# Patient Record
Sex: Male | Born: 1990 | Hispanic: No | Marital: Married | Smoking: Never smoker
Health system: Southern US, Community
[De-identification: ages and names within clinical notes are randomized; demographics above are authoritative.]

## PROBLEM LIST (undated history)

## (undated) ENCOUNTER — Emergency Department (HOSPITAL_BASED_OUTPATIENT_CLINIC_OR_DEPARTMENT_OTHER): Disposition: A | Discharge status: Home / Self Care | Payer: Self-pay

## (undated) DIAGNOSIS — K219 Gastro-esophageal reflux disease without esophagitis: Secondary | ICD-10-CM

---
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
## 2020-06-13 ENCOUNTER — Other Ambulatory Visit: Payer: Self-pay

## 2020-06-13 ENCOUNTER — Ambulatory Visit (INDEPENDENT_AMBULATORY_CARE_PROVIDER_SITE_OTHER): Payer: Managed Care, Other (non HMO)

## 2020-06-13 ENCOUNTER — Ambulatory Visit (INDEPENDENT_AMBULATORY_CARE_PROVIDER_SITE_OTHER): Payer: Managed Care, Other (non HMO) | Admitting: Podiatry

## 2020-06-13 DIAGNOSIS — M76822 Posterior tibial tendinitis, left leg: Secondary | ICD-10-CM

## 2020-06-13 DIAGNOSIS — M79672 Pain in left foot: Secondary | ICD-10-CM

## 2020-06-13 MED ORDER — TRIAMCINOLONE ACETONIDE 10 MG/ML IJ SUSP
10.0000 mg | Freq: Once | INTRAMUSCULAR | Status: AC
  Administered 2020-06-13: 17:00:00 10 mg

## 2020-06-13 MED ORDER — DICLOFENAC SODIUM 75 MG PO TBEC: 75.0000 mg | DELAYED_RELEASE_TABLET | Freq: Two times a day (BID) | ORAL | 2 refills | Status: DC

## 2020-06-17 NOTE — Progress Notes (Signed)
Subjective:   Patient ID: Henry Diaz, male   DOB: 29 y.o.   MRN: [id number]   HPI Patient presents stating he had a lot of pain in his left foot after rolling his foot and states this is been an ongoing issue and he thinks he has an extra bone in his foot that also has been a problem.  States is been going on for a while off and on and seems to occur about every 6 months and patient does not currently smoke likes to be active   Review of Systems  All other systems reviewed and are negative.       Objective:  Physical Exam Vitals and nursing note reviewed.  Constitutional:      Appearance: He is well-developed and well-nourished.  Cardiovascular:     Pulses: Intact distal pulses.  Pulmonary:     Effort: Pulmonary effort is normal.  Musculoskeletal:        General: Normal range of motion.  Skin:    General: Skin is warm.  Neurological:     Mental Status: He is alert.     Neurovascular status was found to be intact muscle strength was found to be adequate range of motion adequate with inflammation pain of the left posterior tibial tendon as it comes to the insertion navicular with some swelling around the navicular indicating there is inflammation in this area.  Patient is found to have good digit perfusion well oriented x3 with no indication of tendon dysfunction currently.  Patient has good digit perfusion moderate flatfoot deformity     Assessment:  Posterior tibial tendon inflammation with pain at the navicular insertion with possibility for accessory navicular     Plan:  H&P reviewed condition.  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

## 2020-06-24 ENCOUNTER — Other Ambulatory Visit: Payer: Self-pay | Admitting: Podiatry

## 2020-06-24 DIAGNOSIS — M76822 Posterior tibial tendinitis, left leg: Secondary | ICD-10-CM

## 2020-07-04 ENCOUNTER — Ambulatory Visit: Payer: Managed Care, Other (non HMO) | Admitting: Podiatry

## 2020-08-15 ENCOUNTER — Ambulatory Visit: Payer: Managed Care, Other (non HMO) | Admitting: Podiatry

## 2021-04-03 ENCOUNTER — Other Ambulatory Visit: Payer: Self-pay

## 2021-04-03 ENCOUNTER — Ambulatory Visit: Payer: BC Managed Care – PPO | Admitting: Podiatry

## 2021-04-03 DIAGNOSIS — T148XXA Other injury of unspecified body region, initial encounter: Secondary | ICD-10-CM | POA: Diagnosis not present

## 2021-04-03 DIAGNOSIS — M76822 Posterior tibial tendinitis, left leg: Secondary | ICD-10-CM

## 2021-04-03 MED ORDER — DICLOFENAC SODIUM 75 MG PO TBEC: 75.0000 mg | DELAYED_RELEASE_TABLET | Freq: Two times a day (BID) | ORAL | 2 refills | Status: DC

## 2021-04-04 NOTE — Progress Notes (Signed)
Subjective:   Patient ID: Henry Diaz, male   DOB: 30 y.o.   MRN: [id number]   HPI Patient presents stating his left foot has started to really hurt him again and he got good relief for around 4 months after treatment the end of last year but then he played golf and it became bad again   ROS      Objective:  Physical Exam  Neuro vascular status intact with exquisite discomfort around the posterior tibial tendon and inflammation and pain also occurring within the subtalar joint left with most of the pain around the posterior tibial tendon as it inserts into the navicular     Assessment:  Possibility for tear of the posterior tibial tendon left with also accessory navicular hypertrophy of the area     Plan:  H&P reviewed condition great length and I have recommended MRI to try to understand better the pathology with probable consideration for posterior tibial repair along with probable excision of accessory navicular.  This is ordered I spent a great deal time going over it with him and we will get results and decide what could be best for him long-term

## 2021-04-22 ENCOUNTER — Ambulatory Visit
Admission: RE | Admit: 2021-04-22 | Discharge: 2021-04-22 | Disposition: A | Discharge status: Home / Self Care | Payer: BC Managed Care – PPO | Source: Ambulatory Visit | Attending: Podiatry | Admitting: Podiatry

## 2021-04-22 ENCOUNTER — Other Ambulatory Visit: Payer: Self-pay

## 2021-04-22 DIAGNOSIS — M76822 Posterior tibial tendinitis, left leg: Secondary | ICD-10-CM

## 2021-04-22 DIAGNOSIS — T148XXA Other injury of unspecified body region, initial encounter: Secondary | ICD-10-CM

## 2021-05-05 ENCOUNTER — Ambulatory Visit: Payer: BC Managed Care – PPO | Admitting: Podiatry

## 2021-05-05 ENCOUNTER — Encounter: Payer: Self-pay | Admitting: Podiatry

## 2021-05-05 ENCOUNTER — Other Ambulatory Visit: Payer: Self-pay

## 2021-05-05 DIAGNOSIS — M76822 Posterior tibial tendinitis, left leg: Secondary | ICD-10-CM

## 2021-05-05 DIAGNOSIS — D169 Benign neoplasm of bone and articular cartilage, unspecified: Secondary | ICD-10-CM

## 2021-05-05 NOTE — Progress Notes (Signed)
Subjective:   Patient ID: Henry Diaz, male   DOB: 30 y.o.   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  I have recommended resection of the accessory navicular with possible tightening of the posterior tibial tendon with Kidner type procedure and I reviewed consent form with patient going over all possible complications as outlined and the fact that total recovery for something like this can take approximately 6 months.  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  All questions answered to the best of my ability surgery as scheduled encouraged to call questions concerns which may arise with patient to have a resection of the accessory navicular and possible reattachment of the posterior tibial tendon with exploration of the tendon

## 2021-05-08 ENCOUNTER — Telehealth: Payer: Self-pay

## 2021-05-08 NOTE — Telephone Encounter (Signed)
I would prefer not to use a steroid this close to surgery. He could have meloicam 15mg . One per day to take for the next several weeks and stop one week prior to procedure

## 2021-05-08 NOTE — Telephone Encounter (Signed)
Error

## 2021-05-08 NOTE — Telephone Encounter (Signed)
   Is there another anti-inflammatory steroid he can take to keep the swelling down with until his surgery?   1. Which medications need to be refilled? (please list name of each medication and dose if known) prednisone ?   2. Which pharmacy/location (including street and city if local pharmacy) is medication to be sent to? [place] and [address]   3. Requesting another round of prednisone to keep the swelling down

## 2021-05-09 ENCOUNTER — Telehealth: Payer: Self-pay | Admitting: Urology

## 2021-05-09 NOTE — Telephone Encounter (Signed)
Spoke with patient he is agreeable to this plan, he said he will only take the meloicam 15mg  if he is having swelling.  If you could call the [place]   [place]  - [facility] - [phone]

## 2021-05-09 NOTE — Telephone Encounter (Signed)
DOS - 06/03/21  KINDER ADVANCED TENDON LEFT ---[id number]   [employer] EFFECTIVEW DATE - 02/27/21   NO PRIOR AUTH REQUIRED

## 2021-05-13 ENCOUNTER — Telehealth: Payer: Self-pay

## 2021-05-13 NOTE — Telephone Encounter (Signed)
Stacie called to cancel his surgery with Dr. Paulla Dolly on 06/03/2021. He stated he got a second opinion and will be having surgery with that provider. Notified Dr. Paulla Dolly and Caren Griffins with [facility]

## 2021-06-09 ENCOUNTER — Encounter: Payer: BC Managed Care – PPO | Admitting: Podiatry

## 2021-08-12 ENCOUNTER — Ambulatory Visit: Payer: Self-pay

## 2021-08-12 NOTE — Telephone Encounter (Signed)
Pt. Asking if [facility] offers "IV drips with vitamins." Instructed pt. He could ask at reception and could be treated as needed.

## 2021-08-21 ENCOUNTER — Encounter (HOSPITAL_BASED_OUTPATIENT_CLINIC_OR_DEPARTMENT_OTHER): Payer: Self-pay | Admitting: Emergency Medicine

## 2021-08-21 ENCOUNTER — Inpatient Hospital Stay (HOSPITAL_BASED_OUTPATIENT_CLINIC_OR_DEPARTMENT_OTHER)
Admission: EM | Admit: 2021-08-21 | Discharge: 2021-08-24 | DRG: 392 | Disposition: A | Discharge status: Home / Self Care | Payer: BC Managed Care – PPO | Attending: Internal Medicine | Admitting: Internal Medicine

## 2021-08-21 ENCOUNTER — Emergency Department (HOSPITAL_BASED_OUTPATIENT_CLINIC_OR_DEPARTMENT_OTHER): Payer: BC Managed Care – PPO | Admitting: Radiology

## 2021-08-21 ENCOUNTER — Emergency Department (HOSPITAL_BASED_OUTPATIENT_CLINIC_OR_DEPARTMENT_OTHER): Payer: BC Managed Care – PPO

## 2021-08-21 ENCOUNTER — Other Ambulatory Visit: Payer: Self-pay

## 2021-08-21 DIAGNOSIS — R1310 Dysphagia, unspecified: Secondary | ICD-10-CM

## 2021-08-21 DIAGNOSIS — Z789 Other specified health status: Secondary | ICD-10-CM | POA: Diagnosis present

## 2021-08-21 DIAGNOSIS — E663 Overweight: Secondary | ICD-10-CM | POA: Diagnosis present

## 2021-08-21 DIAGNOSIS — K2980 Duodenitis without bleeding: Secondary | ICD-10-CM | POA: Diagnosis present

## 2021-08-21 DIAGNOSIS — E876 Hypokalemia: Principal | ICD-10-CM | POA: Diagnosis present

## 2021-08-21 DIAGNOSIS — K760 Fatty (change of) liver, not elsewhere classified: Secondary | ICD-10-CM | POA: Diagnosis present

## 2021-08-21 DIAGNOSIS — K449 Diaphragmatic hernia without obstruction or gangrene: Secondary | ICD-10-CM | POA: Diagnosis present

## 2021-08-21 DIAGNOSIS — Z808 Family history of malignant neoplasm of other organs or systems: Secondary | ICD-10-CM

## 2021-08-21 DIAGNOSIS — E860 Dehydration: Secondary | ICD-10-CM | POA: Diagnosis present

## 2021-08-21 DIAGNOSIS — Z20822 Contact with and (suspected) exposure to covid-19: Secondary | ICD-10-CM | POA: Diagnosis present

## 2021-08-21 DIAGNOSIS — Z79899 Other long term (current) drug therapy: Secondary | ICD-10-CM

## 2021-08-21 DIAGNOSIS — K2960 Other gastritis without bleeding: Secondary | ICD-10-CM | POA: Diagnosis present

## 2021-08-21 DIAGNOSIS — K2100 Gastro-esophageal reflux disease with esophagitis, without bleeding: Principal | ICD-10-CM | POA: Diagnosis present

## 2021-08-21 DIAGNOSIS — R9431 Abnormal electrocardiogram [ECG] [EKG]: Secondary | ICD-10-CM | POA: Diagnosis present

## 2021-08-21 DIAGNOSIS — R634 Abnormal weight loss: Secondary | ICD-10-CM | POA: Diagnosis present

## 2021-08-21 DIAGNOSIS — K222 Esophageal obstruction: Secondary | ICD-10-CM | POA: Diagnosis present

## 2021-08-21 DIAGNOSIS — R202 Paresthesia of skin: Secondary | ICD-10-CM | POA: Diagnosis present

## 2021-08-21 DIAGNOSIS — K2210 Ulcer of esophagus without bleeding: Secondary | ICD-10-CM | POA: Diagnosis present

## 2021-08-21 DIAGNOSIS — Z6827 Body mass index (BMI) 27.0-27.9, adult: Secondary | ICD-10-CM

## 2021-08-21 DIAGNOSIS — D539 Nutritional anemia, unspecified: Secondary | ICD-10-CM | POA: Diagnosis present

## 2021-08-21 DIAGNOSIS — F1010 Alcohol abuse, uncomplicated: Secondary | ICD-10-CM | POA: Diagnosis present

## 2021-08-21 DIAGNOSIS — Z888 Allergy status to other drugs, medicaments and biological substances status: Secondary | ICD-10-CM

## 2021-08-21 HISTORY — DX: Gastro-esophageal reflux disease without esophagitis: K21.9

## 2021-08-21 LAB — COMPREHENSIVE METABOLIC PANEL
ALT: 47 U/L — ABNORMAL HIGH (ref 0–44)
AST: 87 U/L — ABNORMAL HIGH (ref 15–41)
Albumin: 3.6 g/dL (ref 3.5–5.0)
Alkaline Phosphatase: 80 U/L (ref 38–126)
Anion gap: 13 (ref 5–15)
BUN: 6 mg/dL (ref 6–20)
CO2: 39 mmol/L — ABNORMAL HIGH (ref 22–32)
Calcium: 8.8 mg/dL — ABNORMAL LOW (ref 8.9–10.3)
Chloride: 87 mmol/L — ABNORMAL LOW (ref 98–111)
Creatinine, Ser: 0.73 mg/dL (ref 0.61–1.24)
GFR, Estimated: >60 mL/min (ref >60)
Glucose, Bld: 115 mg/dL — ABNORMAL HIGH (ref 70–99)
Potassium: 2.1 mmol/L — CL (ref 3.5–5.1)
Sodium: 139 mmol/L (ref 135–145)
Total Bilirubin: 1.5 mg/dL — ABNORMAL HIGH (ref 0.3–1.2)
Total Protein: 5.4 g/dL — ABNORMAL LOW (ref 6.5–8.1)

## 2021-08-21 LAB — CBC WITH DIFFERENTIAL/PLATELET
Abs Immature Granulocytes: 0.01 10*3/uL (ref 0.00–0.07)
Basophils Absolute: 0 10*3/uL (ref 0.0–0.1)
Basophils Relative: 1 %
Eosinophils Absolute: 0 10*3/uL (ref 0.0–0.5)
Eosinophils Relative: 0 %
HCT: 37.1 % — ABNORMAL LOW (ref 39.0–52.0)
Hemoglobin: 13.8 g/dL (ref 13.0–17.0)
Immature Granulocytes: 0 %
Lymphocytes Relative: 26 %
Lymphs Abs: 1.6 10*3/uL (ref 0.7–4.0)
MCH: 36.3 pg — ABNORMAL HIGH (ref 26.0–34.0)
MCHC: 37.2 g/dL — ABNORMAL HIGH (ref 30.0–36.0)
MCV: 97.6 fL (ref 80.0–100.0)
Monocytes Absolute: 0.5 10*3/uL (ref 0.1–1.0)
Monocytes Relative: 8 %
Neutro Abs: 4 10*3/uL (ref 1.7–7.7)
Neutrophils Relative %: 65 %
Platelets: 280 10*3/uL (ref 150–400)
RBC: 3.8 MIL/uL — ABNORMAL LOW (ref 4.22–5.81)
RDW: 15.8 % — ABNORMAL HIGH (ref 11.5–15.5)
WBC: 6.2 10*3/uL (ref 4.0–10.5)
nRBC: 0 % (ref 0.0–0.2)

## 2021-08-21 LAB — RESP PANEL BY RT-PCR (FLU A&B, COVID) ARPGX2
Influenza A by PCR: NEGATIVE
Influenza B by PCR: NEGATIVE
SARS Coronavirus 2 by RT PCR: NEGATIVE

## 2021-08-21 LAB — MAGNESIUM: Magnesium: 1.5 mg/dL — ABNORMAL LOW (ref 1.7–2.4)

## 2021-08-21 LAB — LIPASE, BLOOD: Lipase: 34 U/L (ref 11–51)

## 2021-08-21 MED ORDER — MAGNESIUM SULFATE 2 GM/50ML IV SOLN
2.0000 g | Freq: Once | INTRAVENOUS | Status: AC
  Administered 2021-08-21: 2 g via INTRAVENOUS
  Filled 2021-08-21: qty 50

## 2021-08-21 MED ORDER — POTASSIUM CHLORIDE 10 MEQ/100ML IV SOLN
10.0000 meq | INTRAVENOUS | Status: AC
  Administered 2021-08-21 (×3): 10 meq via INTRAVENOUS
  Filled 2021-08-21 (×2): qty 100

## 2021-08-21 MED ORDER — SODIUM CHLORIDE 0.9 % IV BOLUS
1000.0000 mL | Freq: Once | INTRAVENOUS | Status: AC
  Administered 2021-08-21: 1000 mL via INTRAVENOUS

## 2021-08-21 MED ORDER — IOHEXOL 300 MG/ML  SOLN
100.0000 mL | Freq: Once | INTRAMUSCULAR | Status: AC | PRN
  Administered 2021-08-21: 100 mL via INTRAVENOUS

## 2021-08-21 MED ORDER — POTASSIUM CHLORIDE CRYS ER 20 MEQ PO TBCR
40.0000 meq | EXTENDED_RELEASE_TABLET | Freq: Once | ORAL | Status: AC
  Administered 2021-08-21: 40 meq via ORAL
  Filled 2021-08-21: qty 2

## 2021-08-21 NOTE — ED Triage Notes (Signed)
States  had blood frawn yesterday and was called and told his K was 2.8 , pt states he has been feeling nausea and  vomiting x months , first time saw dr was yesterday has lost wight 55 lbs in 5 monts

## 2021-08-21 NOTE — ED Notes (Signed)
Called [facility] to transport patient to [facility] room# [phone]

## 2021-08-21 NOTE — ED Provider Notes (Signed)
[facility] EMERGENCY DEPT Provider Note   CSN: [id number] Arrival date & time: 08/21/21  1733     History  Chief Complaint  Patient presents with   Weakness    Henry Diaz is a 31 y.o. male.  31 year old male presents today for evaluation of low potassium.  Patient reports he was evaluated by PCP yesterday and was called today due to low potassium level and was told to come to the emergency room for critical level.  Patient states that his potassium was 2.8.  Patient denies diarrhea.  He does state he has had significantly poor intake over the past 5 months and has lost 55 pounds.  States he has not been able to tolerate any solid foods and if he does eat solid food most of the time he will throw it up within 20 minutes.  States he is able to take in shakes, Ensure, water, or pills without difficulty.  Does endorse abdominal pain.  Denies fever, chills, chest pain, shortness of breath.  Denies night sweats.  Does report tingling in bilateral hands.  Yesterday was his first visit with PCP for work-up of his symptoms.  He has been referred to gastroenterologist and is awaiting a call to get scheduled.  The history is provided by the patient. No language interpreter was used.      Home Medications Prior to Admission medications   Medication Sig Start Date End Date Taking? Authorizing Provider  albuterol (VENTOLIN HFA) 108 (90 Base) MCG/ACT inhaler 2 puffs q.i.d. p.r.n. short of breath, wheezing, or cough 11/18/16   [provider]  azithromycin (ZITHROMAX) 250 MG tablet azithromycin 250 mg tablet    [provider]  benzonatate (TESSALON) 200 MG capsule benzonatate 200 mg capsule 11/24/16   [provider]  ciclopirox (LOPROX) 0.77 % cream ciclopirox 0.77 % topical cream  APPLY TO THE AFFECTED AND SURROUNDING AREAS OF SKIN BY TOPICAL ROUTE 2 TIMES PER DAY IN THE MORNING AND EVENING 2-3 WEEKS    [provider]  ciprofloxacin (CIPRO) 500 MG  tablet ciprofloxacin 500 mg tablet  TAKE 1 TABLET BY MOUTH EVERY 12 HOURS FOR 21 DAYS    [provider]  diclofenac (VOLTAREN) 75 MG EC tablet Take 1 tablet (75 mg total) by mouth 2 (two) times daily. 06/13/20   Wallene Huh, DPM  diclofenac (VOLTAREN) 75 MG EC tablet Take 1 tablet (75 mg total) by mouth 2 (two) times daily. 04/03/21   Wallene Huh, DPM  doxycycline (VIBRA-TABS) 100 MG tablet doxycycline hyclate 100 mg tablet  TK 1 T PO BID FOR 7 DAYS    [provider]  ibuprofen (ADVIL) 800 MG tablet ibuprofen 800 mg tablet    [provider]      Allergies    Ceclor [cefaclor]    Review of Systems   Review of Systems  Constitutional:  Positive for appetite change and fatigue. Negative for chills and fever.  Respiratory:  Negative for cough and shortness of breath.   Cardiovascular:  Negative for chest pain.  Gastrointestinal:  Positive for abdominal pain and vomiting. Negative for constipation and nausea.  Genitourinary:  Negative for dysuria.  All other systems reviewed and are negative.  Physical Exam Updated Vital Signs BP (!) 132/97    Pulse (!) 121    Temp 99.7 F (37.6 C)    Resp 16    Ht 6\' 1"  (1.854 m)    Wt 93.4 kg    SpO2 97%  BMI 27.18 kg/m  Physical Exam Vitals and nursing note reviewed.  Constitutional:      General: He is not in acute distress.    Appearance: Normal appearance. He is not ill-appearing.  HENT:     Head: Normocephalic and atraumatic.     Nose: Nose normal.  Eyes:     General: No scleral icterus.    Extraocular Movements: Extraocular movements intact.     Conjunctiva/sclera: Conjunctivae normal.  Cardiovascular:     Rate and Rhythm: Regular rhythm. Tachycardia present.     Pulses: Normal pulses.     Heart sounds: Normal heart sounds.  Pulmonary:     Effort: Pulmonary effort is normal. No respiratory distress.     Breath sounds: Normal breath sounds. No wheezing or rales.  Abdominal:     General: There is  no distension.     Tenderness: There is abdominal tenderness. There is no right CVA tenderness, left CVA tenderness or guarding.  Musculoskeletal:        General: Normal range of motion.     Cervical back: Normal range of motion.     Right lower leg: No edema.     Left lower leg: No edema.  Skin:    General: Skin is warm and dry.  Neurological:     General: No focal deficit present.     Mental Status: He is alert. Mental status is at baseline.    ED Results / Procedures / Treatments   Labs (all labs ordered are listed, but only abnormal results are displayed) Labs Reviewed  CBC WITH DIFFERENTIAL/PLATELET  COMPREHENSIVE METABOLIC PANEL  MAGNESIUM  LIPASE, BLOOD    EKG None  Radiology No results found.  Procedures .Critical Care Performed by: Evlyn Courier, PA-C Authorized by: Evlyn Courier, PA-C   Critical care provider statement:    Critical care time (minutes):  35   Critical care was necessary to treat or prevent imminent or life-threatening deterioration of the following conditions:  Cardiac failure and metabolic crisis   Critical care was time spent personally by me on the following activities:  Development of treatment plan with patient or surrogate, discussions with consultants, evaluation of patient's response to treatment, examination of patient, ordering and review of laboratory studies, ordering and review of radiographic studies, ordering and performing treatments and interventions, pulse oximetry, re-evaluation of patient's condition and review of old charts   Care discussed with: admitting provider      Medications Ordered in ED Medications  sodium chloride 0.9 % bolus 1,000 mL (has no administration in time range)    ED Course/ Medical Decision Making/ A&P Clinical Course as of 08/21/21 2100  Thu Aug 21, 7338  [id number] 31 year old male present emergency department with complaint of loss of appetite and some dysphagia, it began around Thanksgiving of last year.   He reports that whenever he tries to eat or swallow he feels like the food gets stuck in his midesophagus, and he often vomits afterwards.  He has lost a lot of weight.  He has been trying protein shakes, can often keep down some liquids.  No diarrhea recently.  In the ED his vital signs been within normal limits.  He does have some electrolyte derangement potassium of 2.1 magnesium 1.5.  Both are being repleted IV and orally.  EKG shows sinus rhythm prolonged QTc over 600 per my interpretation.  He will need a repeat EKG after repletion, will be admitted for IV electrolyte repletion.  His PCP is already placed a  referral to gastroenterology for his dysphagia, which I think is appropriate.  His CT scan of the abdomen here did not show signs of evident mass or obstruction as a cause for his vomiting. [MT]    Clinical Course User Index [MT] Trifan, Carola Rhine, MD                           Medical Decision Making Amount and/or Complexity of Data Reviewed Labs: ordered. Radiology: ordered. ECG/medicine tests: ordered.  Risk Prescription drug management.   Medical Decision Making / ED Course   This patient presents to the ED for concern of hypokalemia, lack of appetite, this involves an extensive number of treatment options, and is a complaint that carries with it a high risk of complications and morbidity.  The differential diagnosis includes gastroenteritis, malignancy, cirrhosis, other acute intra-abdominal process  MDM: 31 year old male presents today for evaluation of low potassium level.  Do not have access to labs from PCP.  Will obtain CBC, CMP, magnesium, lipase, CT abdomen and pelvis.  He is tachycardic.  Will provide IV hydration.  Will await CMP before potassium repletion. CMP significant for potassium of 2.1.  EKG with changes.  Mildly elevated transaminitis.  Chloride of 87, glucose of 115, total bilirubin of 1.5 otherwise without acute findings.  CBC without acute findings.  Magnesium  of 1.5.  Lipase within normal limits.  CT abdomen pelvis shows severe fatty liver otherwise without acute findings.  Potassium repletion ordered p.o. plus IV push.  70 mEq total ordered at this time.  Mild improvement in tachycardia following volume repletion.  Patient also drinks about 4 beers per night.  Last drink last night.  Will place on CIWA protocol.  Case discussed with hospitalist who will accept patient for admission.  Recommends chest x-ray and CIWA protocol.  Ordered both.   Additional history obtained: -Additional history obtained from wife at bedside -External records from outside source obtained and reviewed including: Chart review including previous notes, labs, imaging, consultation notes   Lab Tests: -I ordered, reviewed, and interpreted labs.   The pertinent results include:   Labs Reviewed  CBC WITH DIFFERENTIAL/PLATELET  COMPREHENSIVE METABOLIC PANEL  MAGNESIUM  LIPASE, BLOOD      EKG  EKG Interpretation  Date/Time:    Ventricular Rate:    PR Interval:    QRS Duration:   QT Interval:    QTC Calculation:   R Axis:     Text Interpretation:           Imaging Studies ordered: I ordered imaging studies including CXR I independently visualized and interpreted imaging. I agree with the radiologist interpretation   Medicines ordered and prescription drug management: Meds ordered this encounter  Medications   sodium chloride 0.9 % bolus 1,000 mL    -I have reviewed the patients home medicines and have made adjustments as needed  Critical interventions IV potassium and p.o. potassium repletion.  Potassium of 2.1.  Cardiac Monitoring: The patient was maintained on a cardiac monitor.  I personally viewed and interpreted the cardiac monitored which showed an underlying rhythm of: Sinus tachycardia  Reevaluation: After the interventions noted above, I reevaluated the patient and found that they have :improved  Co morbidities that complicate the  patient evaluation  Past Medical History:  Diagnosis Date   GERD (gastroesophageal reflux disease)       Dispostion: Patient will be admitted for further work-up and management.  Final Clinical Impression(s) /  ED Diagnoses Final diagnoses:  Hypokalemia  Dysphagia, unspecified type    Rx / DC Orders ED Discharge Orders     None         Evlyn Courier, PA-C 08/21/21 2101    Wyvonnia Dusky, MD 08/22/21 Laureen Abrahams

## 2021-08-21 NOTE — Progress Notes (Signed)
Plan of Care Note for accepted transfer   Patient: Henry Diaz MRN: [id number]   DOA: 08/21/2021  Facility requesting transfer: [facility] Requesting Provider: PA Amjad Deatra Canter Reason for transfer: Inability to tolerate oral intake, severe hypokalemia Facility course:   31 year old male with past medical history of alcohol abuse (drinks at least 4 alcoholic drinks daily) who has been experiencing a 41-month history of frequent vomiting and unintentional weight loss.  Patient states that he is able to tolerate liquids but usually vomits up solids approximately 20 minutes after eating.  Over the span of time patient reports a 55 pound weight loss.  Patient was seen by his primary care provider yesterday.  During that visit the patient was referred to gastroenterology but the patient has yet to be seen.  Furthermore blood work identified a critically low potassium advised the patient to come to the emergency department.  Upon evaluation in the emergency department patient was found to have a potassium of 2.1 with magnesium of 1.5.  Patient was given 2 g of intravenous magnesium sulfate as well as a total of 70 mill equivalents of potassium.  Patient's last alcoholic drink was yesterday evening and therefore I advised initiating CIWA protocol considering the patient has been tachycardic throughout the emergency department stay.    ER providers requesting hospitalization for continued correction of electrolytes, hydration and work-up of unintentional weight loss including possible gastroenterology consultation.  Gastroenterology has yet to be contacted.  Plan of care: The patient is accepted for admission to Progressive unit, at [facility]    Author: Vernelle Emerald, MD 08/21/2021  Check [email or web address] for on-call coverage.  Nursing staff, Please call [facility] number on Amion as soon as patient's arrival, so appropriate admitting provider can evaluate the  pt.

## 2021-08-22 DIAGNOSIS — K2100 Gastro-esophageal reflux disease with esophagitis, without bleeding: Secondary | ICD-10-CM | POA: Diagnosis present

## 2021-08-22 DIAGNOSIS — K2210 Ulcer of esophagus without bleeding: Secondary | ICD-10-CM | POA: Diagnosis not present

## 2021-08-22 DIAGNOSIS — R9431 Abnormal electrocardiogram [ECG] [EKG]: Secondary | ICD-10-CM | POA: Diagnosis not present

## 2021-08-22 DIAGNOSIS — F1090 Alcohol use, unspecified, uncomplicated: Secondary | ICD-10-CM | POA: Diagnosis present

## 2021-08-22 DIAGNOSIS — D539 Nutritional anemia, unspecified: Secondary | ICD-10-CM | POA: Diagnosis not present

## 2021-08-22 DIAGNOSIS — K449 Diaphragmatic hernia without obstruction or gangrene: Secondary | ICD-10-CM | POA: Diagnosis not present

## 2021-08-22 DIAGNOSIS — Z79899 Other long term (current) drug therapy: Secondary | ICD-10-CM | POA: Diagnosis not present

## 2021-08-22 DIAGNOSIS — K760 Fatty (change of) liver, not elsewhere classified: Secondary | ICD-10-CM

## 2021-08-22 DIAGNOSIS — R634 Abnormal weight loss: Secondary | ICD-10-CM | POA: Diagnosis present

## 2021-08-22 DIAGNOSIS — K2980 Duodenitis without bleeding: Secondary | ICD-10-CM | POA: Diagnosis not present

## 2021-08-22 DIAGNOSIS — Z808 Family history of malignant neoplasm of other organs or systems: Secondary | ICD-10-CM | POA: Diagnosis not present

## 2021-08-22 DIAGNOSIS — R1310 Dysphagia, unspecified: Secondary | ICD-10-CM

## 2021-08-22 DIAGNOSIS — K222 Esophageal obstruction: Secondary | ICD-10-CM | POA: Diagnosis not present

## 2021-08-22 DIAGNOSIS — E876 Hypokalemia: Secondary | ICD-10-CM | POA: Diagnosis present

## 2021-08-22 DIAGNOSIS — F1010 Alcohol abuse, uncomplicated: Secondary | ICD-10-CM | POA: Diagnosis not present

## 2021-08-22 DIAGNOSIS — K2960 Other gastritis without bleeding: Secondary | ICD-10-CM | POA: Diagnosis not present

## 2021-08-22 DIAGNOSIS — Z6827 Body mass index (BMI) 27.0-27.9, adult: Secondary | ICD-10-CM | POA: Diagnosis not present

## 2021-08-22 DIAGNOSIS — R202 Paresthesia of skin: Secondary | ICD-10-CM | POA: Diagnosis not present

## 2021-08-22 DIAGNOSIS — Z888 Allergy status to other drugs, medicaments and biological substances status: Secondary | ICD-10-CM | POA: Diagnosis not present

## 2021-08-22 DIAGNOSIS — E860 Dehydration: Secondary | ICD-10-CM | POA: Diagnosis not present

## 2021-08-22 DIAGNOSIS — Z789 Other specified health status: Secondary | ICD-10-CM

## 2021-08-22 DIAGNOSIS — E663 Overweight: Secondary | ICD-10-CM | POA: Diagnosis not present

## 2021-08-22 DIAGNOSIS — Z20822 Contact with and (suspected) exposure to covid-19: Secondary | ICD-10-CM | POA: Diagnosis not present

## 2021-08-22 LAB — COMPREHENSIVE METABOLIC PANEL
ALT: 43 U/L (ref 0–44)
AST: 79 U/L — ABNORMAL HIGH (ref 15–41)
Albumin: 2.9 g/dL — ABNORMAL LOW (ref 3.5–5.0)
Alkaline Phosphatase: 66 U/L (ref 38–126)
Anion gap: 9 (ref 5–15)
BUN: 6 mg/dL (ref 6–20)
CO2: 35 mmol/L — ABNORMAL HIGH (ref 22–32)
Calcium: 7.9 mg/dL — ABNORMAL LOW (ref 8.9–10.3)
Chloride: 92 mmol/L — ABNORMAL LOW (ref 98–111)
Creatinine, Ser: 0.73 mg/dL (ref 0.61–1.24)
GFR, Estimated: >60 mL/min (ref >60)
Glucose, Bld: 99 mg/dL (ref 70–99)
Potassium: 2.2 mmol/L — CL (ref 3.5–5.1)
Sodium: 136 mmol/L (ref 135–145)
Total Bilirubin: 1.8 mg/dL — ABNORMAL HIGH (ref 0.3–1.2)
Total Protein: 5.1 g/dL — ABNORMAL LOW (ref 6.5–8.1)

## 2021-08-22 LAB — BASIC METABOLIC PANEL
Anion gap: 9 (ref 5–15)
BUN: <5 mg/dL — ABNORMAL LOW (ref 6–20)
CO2: 37 mmol/L — ABNORMAL HIGH (ref 22–32)
Calcium: 8.1 mg/dL — ABNORMAL LOW (ref 8.9–10.3)
Chloride: 91 mmol/L — ABNORMAL LOW (ref 98–111)
Creatinine, Ser: 0.72 mg/dL (ref 0.61–1.24)
GFR, Estimated: >60 mL/min (ref >60)
Glucose, Bld: 102 mg/dL — ABNORMAL HIGH (ref 70–99)
Potassium: 2.6 mmol/L — CL (ref 3.5–5.1)
Sodium: 137 mmol/L (ref 135–145)

## 2021-08-22 LAB — PHOSPHORUS: Phosphorus: 2.8 mg/dL (ref 2.5–4.6)

## 2021-08-22 LAB — POTASSIUM
Potassium: 2.7 mmol/L — CL (ref 3.5–5.1)
Potassium: 2.6 mmol/L — CL (ref 3.5–5.1)

## 2021-08-22 LAB — MAGNESIUM: Magnesium: 2.2 mg/dL (ref 1.7–2.4)

## 2021-08-22 LAB — HIV ANTIBODY (ROUTINE TESTING W REFLEX): HIV Screen 4th Generation wRfx: NONREACTIVE

## 2021-08-22 MED ORDER — ALBUTEROL SULFATE (2.5 MG/3ML) 0.083% IN NEBU: 3.0000 mL | INHALATION_SOLUTION | RESPIRATORY_TRACT | Status: DC | PRN

## 2021-08-22 MED ORDER — ACETAMINOPHEN 650 MG RE SUPP: 650.0000 mg | Freq: Four times a day (QID) | RECTAL | Status: DC | PRN

## 2021-08-22 MED ORDER — POTASSIUM CHLORIDE CRYS ER 20 MEQ PO TBCR
40.0000 meq | EXTENDED_RELEASE_TABLET | Freq: Once | ORAL | Status: AC
  Administered 2021-08-22: 40 meq via ORAL
  Filled 2021-08-22: qty 2

## 2021-08-22 MED ORDER — PANTOPRAZOLE SODIUM 40 MG IV SOLR
40.0000 mg | Freq: Two times a day (BID) | INTRAVENOUS | Status: DC
  Administered 2021-08-22 – 2021-08-24 (×5): 40 mg via INTRAVENOUS
  Filled 2021-08-22 (×5): qty 10

## 2021-08-22 MED ORDER — THIAMINE HCL 100 MG PO TABS
100.0000 mg | ORAL_TABLET | Freq: Every day | ORAL | Status: DC
  Administered 2021-08-22: 100 mg via ORAL
  Filled 2021-08-22 (×3): qty 1

## 2021-08-22 MED ORDER — ONDANSETRON HCL 4 MG/2ML IJ SOLN: 4.0000 mg | Freq: Four times a day (QID) | INTRAMUSCULAR | Status: DC | PRN

## 2021-08-22 MED ORDER — THIAMINE HCL 100 MG/ML IJ SOLN
100.0000 mg | Freq: Every day | INTRAMUSCULAR | Status: DC
  Administered 2021-08-23: 100 mg via INTRAVENOUS
  Filled 2021-08-22 (×2): qty 2

## 2021-08-22 MED ORDER — POTASSIUM CHLORIDE 10 MEQ/100ML IV SOLN
10.0000 meq | INTRAVENOUS | Status: AC
  Administered 2021-08-22 – 2021-08-23 (×2): 10 meq via INTRAVENOUS
  Filled 2021-08-22 (×2): qty 100

## 2021-08-22 MED ORDER — LORAZEPAM 2 MG/ML IJ SOLN: 0.0000 mg | Freq: Two times a day (BID) | INTRAMUSCULAR | Status: DC

## 2021-08-22 MED ORDER — ADULT MULTIVITAMIN W/MINERALS CH
1.0000 | ORAL_TABLET | Freq: Every day | ORAL | Status: DC
  Administered 2021-08-22 – 2021-08-24 (×2): 1 via ORAL
  Filled 2021-08-22 (×3): qty 1

## 2021-08-22 MED ORDER — MELATONIN 5 MG PO TABS
5.0000 mg | ORAL_TABLET | Freq: Every day | ORAL | Status: DC
  Administered 2021-08-22 – 2021-08-23 (×2): 5 mg via ORAL
  Filled 2021-08-22 (×2): qty 1

## 2021-08-22 MED ORDER — FOLIC ACID 1 MG PO TABS
1.0000 mg | ORAL_TABLET | Freq: Every day | ORAL | Status: DC
  Administered 2021-08-22 – 2021-08-24 (×2): 1 mg via ORAL
  Filled 2021-08-22 (×3): qty 1

## 2021-08-22 MED ORDER — ACETAMINOPHEN 325 MG PO TABS: 650.0000 mg | ORAL_TABLET | Freq: Four times a day (QID) | ORAL | Status: DC | PRN

## 2021-08-22 MED ORDER — POTASSIUM CHLORIDE CRYS ER 20 MEQ PO TBCR
40.0000 meq | EXTENDED_RELEASE_TABLET | ORAL | Status: AC
  Administered 2021-08-22 (×2): 40 meq via ORAL
  Filled 2021-08-22 (×2): qty 2

## 2021-08-22 MED ORDER — LORAZEPAM 2 MG/ML IJ SOLN
1.0000 mg | INTRAMUSCULAR | Status: DC | PRN
  Administered 2021-08-23: 1 mg via INTRAVENOUS

## 2021-08-22 MED ORDER — KCL IN DEXTROSE-NACL 20-5-0.45 MEQ/L-%-% IV SOLN
INTRAVENOUS | Status: DC
  Filled 2021-08-22 (×4): qty 1000

## 2021-08-22 MED ORDER — POTASSIUM CHLORIDE 10 MEQ/100ML IV SOLN
10.0000 meq | INTRAVENOUS | Status: AC
  Administered 2021-08-22 (×2): 10 meq via INTRAVENOUS
  Filled 2021-08-22 (×2): qty 100

## 2021-08-22 MED ORDER — LORAZEPAM 2 MG/ML IJ SOLN
0.0000 mg | Freq: Four times a day (QID) | INTRAMUSCULAR | Status: AC
  Filled 2021-08-22: qty 1

## 2021-08-22 MED ORDER — LORAZEPAM 1 MG PO TABS
1.0000 mg | ORAL_TABLET | ORAL | Status: DC | PRN
  Administered 2021-08-22: 1 mg via ORAL
  Filled 2021-08-22: qty 1

## 2021-08-22 MED ORDER — ONDANSETRON HCL 4 MG PO TABS
4.0000 mg | ORAL_TABLET | Freq: Four times a day (QID) | ORAL | Status: DC | PRN
Start: 2021-08-22 — End: 2021-08-24

## 2021-08-22 NOTE — Assessment & Plan Note (Addendum)
-   Status post EGD.  Findings as below.  Currently tolerating diet.  Outpatient follow-up with GI.

## 2021-08-22 NOTE — Assessment & Plan Note (Addendum)
-   Secondary to dysphagia, has lost close to 50 pounds in the last 6 months, most of it in the last 2 months

## 2021-08-22 NOTE — Assessment & Plan Note (Addendum)
-   Treated with aggressive oral and IV replacement.  Potassium 3.5 this morning.  Will give K Dur 40 mEq 1 dose today. -Continue supplementation with oral potassium upon discharge with outpatient follow-up of BMP within a week by PCP.  Encourage oral intake.

## 2021-08-22 NOTE — Assessment & Plan Note (Addendum)
-   Treated with IV fluids.  Improved.

## 2021-08-22 NOTE — Assessment & Plan Note (Addendum)
-   Likely secondary to severe hypokalemia, hypomagnesemia -replaced potassium, magnesium.   -Avoid QT prolonging medications.

## 2021-08-22 NOTE — H&P (View-Only) (Signed)
Referring Provider: Mendel Corning, MD Primary Care Physician:  Everardo Beals, NP Primary Gastroenterologist:  Althia Forts  Reason for Consultation:  Dysphagia, weight loss  HPI: Henry Diaz is a 31 y.o. male with past medical history of alcohol abuse (drinks at least 4 alcoholic drinks daily) who has been experiencing a 70-month history of frequent vomiting and unintentional weight loss.  Patient states that he is able to tolerate liquids but usually regurgitates solids approximately 20 minutes after eating.   Over the span of time patient reports a 55 pound weight loss.  Patient was seen by his primary care provider yesterday.  During that visit the patient was referred to gastroenterology but the patient has yet to be seen.  Furthermore blood work identified a critically low potassium, advised the patient to come to the emergency department.   Upon evaluation in the emergency department patient was found to have a potassium of 2.1 with magnesium of 1.5.  Patient was given 2 g of intravenous magnesium sulfate as well as a total of 70 milli equivalents of potassium.   Patient resting in bed today, his wife is at bedside.  He reports that he has had about 50 pounds of weight loss in the last 5 months with worsening dysphagia to solids.  Denies problems with liquids.  States that he feels food gets stuck in his chest and will often have to regurgitate, but this does not occur with every meal.  Denies nausea or vomiting.  Denies hematemesis.  Denies abdominal pain.  He has a history of acid reflux but states he has not had any problems with this in the last 5 months as he has not been eating his normal diet, and is not on any reflux medications at this time.  Reports that stools have been softer since he has been eating primarily a liquid diet with some soft foods.  Denies diarrhea, constipation, melena, hematochezia.  Patient denies other medical conditions and denies taking any regular  medications.  He does have history of heavy drinking.  States he normally drinks alcohol daily and will have up to 10 beers if he is at a game or event.  He denies smoking but states he does chew smokeless tobacco.    Denies family history of GI malignancy.  Denies prior EGD or colonoscopy.  Denies MI/stroke history, is not on any blood thinners.  No record of prior EGD or colonoscopy.  Past Medical History:  Diagnosis Date   GERD (gastroesophageal reflux disease)     No past surgical history on file.  Prior to Admission medications   Medication Sig Start Date End Date Taking? Authorizing Provider  Multiple Vitamin (MULTIVITAMIN WITH MINERALS) TABS tablet Take 1 tablet by mouth daily.   Yes [provider]  ondansetron (ZOFRAN-ODT) 4 MG disintegrating tablet Take 4 mg by mouth every 4 (four) hours as needed for nausea/vomiting. 08/13/21  Yes [provider]  albuterol (VENTOLIN HFA) 108 (90 Base) MCG/ACT inhaler 2 puffs q.i.d. p.r.n. short of breath, wheezing, or cough Patient not taking: Reported on 08/22/2021 11/18/16   [provider]  diclofenac (VOLTAREN) 75 MG EC tablet Take 1 tablet (75 mg total) by mouth 2 (two) times daily. Patient not taking: Reported on 08/22/2021 04/03/21   Wallene Huh, DPM    Scheduled Meds:  folic acid  1 mg Oral Daily   LORazepam  0-4 mg Intravenous Q6H   Followed by   Derrill Memo ON 08/24/2021] LORazepam  0-4 mg Intravenous Q12H  melatonin  5 mg Oral QHS   multivitamin with minerals  1 tablet Oral Daily   pantoprazole (PROTONIX) IV  40 mg Intravenous Q12H   thiamine  100 mg Oral Daily   Or   thiamine  100 mg Intravenous Daily   Continuous Infusions:  dextrose 5 % and 0.45 % NaCl with KCl 20 mEq/L     PRN Meds:.acetaminophen **OR** acetaminophen, albuterol, LORazepam **OR** LORazepam, ondansetron **OR** ondansetron (ZOFRAN) IV  Allergies as of 08/21/2021 - Review Complete 08/21/2021  Allergen Reaction Noted   Ceclor  [cefaclor]  08/21/2021    History reviewed. No pertinent family history.  Social History   Socioeconomic History   Marital status: Single    Spouse name: Not on file   Number of children: Not on file   Years of education: Not on file   Highest education level: Not on file  Occupational History   Not on file  Tobacco Use   Smoking status: Never    Passive exposure: Never   Smokeless tobacco: Never  Vaping Use   Vaping Use: Never used  Substance and Sexual Activity   Alcohol use: Yes   Drug use: Yes    Types: Marijuana   Sexual activity: Not on file  Other Topics Concern   Not on file  Social History Narrative   Not on file   Social Determinants of Health   Financial Resource Strain: Not on file  Food Insecurity: Not on file  Transportation Needs: Not on file  Physical Activity: Not on file  Stress: Not on file  Social Connections: Not on file  Intimate Partner Violence: Not on file    Review of Systems: Review of Systems  Constitutional:  Positive for weight loss (50 pounds in 5 months). Negative for chills and fever.  Eyes:  Negative for discharge and redness.  Respiratory:  Negative for shortness of breath, wheezing and stridor.   Cardiovascular:  Negative for chest pain and leg swelling.  Gastrointestinal:  Negative for abdominal pain, blood in stool, constipation, diarrhea, heartburn, melena, nausea and vomiting.       Dysphagia to solids, regurgitation, history of reflux but not in the last 5 months  Genitourinary:  Negative for dysuria and urgency.  Musculoskeletal:  Negative for falls and joint pain.  Skin:  Negative for itching and rash.  Neurological:  Negative for seizures and loss of consciousness.  Endo/Heme/Allergies:  Negative for environmental allergies. Does not bruise/bleed easily.  Psychiatric/Behavioral:  Negative for memory loss. The patient is not nervous/anxious.     Physical Exam: Physical Exam Constitutional:      Appearance: Normal  appearance.  HENT:     Head: Normocephalic and atraumatic.     Right Ear: External ear normal.     Left Ear: External ear normal.     Nose: Nose normal.     Mouth/Throat:     Mouth: Mucous membranes are moist.     Pharynx: Oropharynx is clear.  Eyes:     Extraocular Movements: Extraocular movements intact.     Conjunctiva/sclera: Conjunctivae normal.  Cardiovascular:     Rate and Rhythm: Normal rate and regular rhythm.     Pulses: Normal pulses.     Heart sounds: Normal heart sounds.  Pulmonary:     Effort: Pulmonary effort is normal.     Breath sounds: Normal breath sounds.  Abdominal:     General: Bowel sounds are normal. There is no distension.     Palpations: Abdomen is soft.  Tenderness: There is no abdominal tenderness.  Musculoskeletal:     Cervical back: Normal range of motion and neck supple.     Right lower leg: No edema.     Left lower leg: No edema.  Skin:    General: Skin is warm and dry.  Neurological:     General: No focal deficit present.     Mental Status: He is alert and oriented to person, place, and time.  Psychiatric:        Mood and Affect: Mood normal.        Behavior: Behavior normal.     Vital signs: Vitals:   08/22/21 0239 08/22/21 0653  BP: (!) 145/105 (!) 131/95  Pulse: (!) 102 100  Resp: 20 20  Temp: 98.6 F (37 C) 98.1 F (36.7 C)  SpO2: 98% 99%   Last BM Date : 08/21/21    GI:  Lab Results: Recent Labs    08/21/21 1820  WBC 6.2  HGB 13.8  HCT 37.1*  PLT 280   BMET Recent Labs    08/21/21 1820 08/22/21 0422  NA 139 136  K 2.1* 2.2*  CL 87* 92*  CO2 39* 35*  GLUCOSE 115* 99  BUN 6 6  CREATININE 0.73 0.73  CALCIUM 8.8* 7.9*   LFT Recent Labs    08/22/21 0422  PROT 5.1*  ALBUMIN 2.9*  AST 79*  ALT 43  ALKPHOS 66  BILITOT 1.8*   PT/INR No results for input(s): LABPROT, INR in the last 72 hours.   Studies/Results: DG Chest 2 View  Result Date: 08/21/2021 CLINICAL DATA:  Nausea, vomiting,  aspiration EXAM: CHEST - 2 VIEW COMPARISON:  None. FINDINGS: The heart size and mediastinal contours are within normal limits. Both lungs are clear. The visualized skeletal structures are unremarkable. IMPRESSION: No active cardiopulmonary disease. Electronically Signed   By: Fidela Salisbury M.D.   On: 08/21/2021 21:53   CT ABDOMEN PELVIS W CONTRAST  Result Date: 08/21/2021 CLINICAL DATA:  Abdominal pain. EXAM: CT ABDOMEN AND PELVIS WITH CONTRAST TECHNIQUE: Multidetector CT imaging of the abdomen and pelvis was performed using the standard protocol following bolus administration of intravenous contrast. RADIATION DOSE REDUCTION: This exam was performed according to the departmental dose-optimization program which includes automated exposure control, adjustment of the mA and/or kV according to patient size and/or use of iterative reconstruction technique. CONTRAST:  116mL OMNIPAQUE IOHEXOL 300 MG/ML  SOLN COMPARISON:  None. FINDINGS: Lower chest: The visualized lung bases are clear. No intra-abdominal free air or free fluid. Hepatobiliary: Severe fatty liver. No intrahepatic biliary dilatation. Gallbladder sludge. No calcified stone or pericholecystic fluid. Pancreas: Unremarkable. No pancreatic ductal dilatation or surrounding inflammatory changes. Spleen: Normal in size without focal abnormality. Adrenals/Urinary Tract: The adrenal glands unremarkable. There is a 3 mm nonobstructing left renal upper pole calculus. No hydronephrosis. The right kidney is unremarkable. The visualized ureters and urinary bladder appear unremarkable. Stomach/Bowel: There is no bowel obstruction or active inflammation. The appendix is normal. Vascular/Lymphatic: The abdominal aorta and IVC unremarkable. No portal venous gas. There is no adenopathy. Reproductive: The prostate and seminal vesicles are grossly unremarkable. No pelvic mass. Other: None Musculoskeletal: No acute or significant osseous findings. IMPRESSION: 1. A 3 mm  nonobstructing left renal upper pole calculus. No hydronephrosis. 2. Severe fatty liver. 3. No bowel obstruction. Normal appendix. Electronically Signed   By: Anner Crete M.D.   On: 08/21/2021 19:44    Impression: Dysphagia to solids; weight loss -Intermittent dysphagia to solids over  the last 5 months with 50 pound weight loss, worsening since Thanksgiving.  Primarily on a diet of protein shakes at this time. - LFTs trending down - BUN <5, Cr. 0.72 - Potassium 2.6 - Hgb 13.8  Fatty liver - CT abdomen and pelvis showed 3 mm nonobstructing left renal upper pole calculus, no hydronephrosis, severe fatty liver with gallbladder sludge.  No calcified stone or pericholecystic fluid  Plan: Plan for EGD tomorrow. I thoroughly discussed the procedures to include nature, alternatives, benefits, and risks including but not limited to bleeding, perforation, infection, anesthesia/cardiac and pulmonary complications. Patient provides understanding and gave verbal consent to proceed. Continue Protonix IV 40mg  BID Continue clear liquid diet NPO at midnight Continue supportive care as needed. [facility] will follow.        LOS: 0 days   Angelique Holm  PA-C 08/22/2021, 9:08 AM  Contact #  [phone]

## 2021-08-22 NOTE — Consult Note (Signed)
Referring Provider: Mendel Corning, MD Primary Care Physician:  Everardo Beals, NP Primary Gastroenterologist:  Althia Forts  Reason for Consultation:  Dysphagia, weight loss  HPI: Henry Diaz is a 31 y.o. male with past medical history of alcohol abuse (drinks at least 4 alcoholic drinks daily) who has been experiencing a 51-month history of frequent vomiting and unintentional weight loss.  Patient states that he is able to tolerate liquids but usually regurgitates solids approximately 20 minutes after eating.   Over the span of time patient reports a 55 pound weight loss.  Patient was seen by his primary care provider yesterday.  During that visit the patient was referred to gastroenterology but the patient has yet to be seen.  Furthermore blood work identified a critically low potassium, advised the patient to come to the emergency department.   Upon evaluation in the emergency department patient was found to have a potassium of 2.1 with magnesium of 1.5.  Patient was given 2 g of intravenous magnesium sulfate as well as a total of 70 milli equivalents of potassium.   Patient resting in bed today, his wife is at bedside.  He reports that he has had about 50 pounds of weight loss in the last 5 months with worsening dysphagia to solids.  Denies problems with liquids.  States that he feels food gets stuck in his chest and will often have to regurgitate, but this does not occur with every meal.  Denies nausea or vomiting.  Denies hematemesis.  Denies abdominal pain.  He has a history of acid reflux but states he has not had any problems with this in the last 5 months as he has not been eating his normal diet, and is not on any reflux medications at this time.  Reports that stools have been softer since he has been eating primarily a liquid diet with some soft foods.  Denies diarrhea, constipation, melena, hematochezia.  Patient denies other medical conditions and denies taking any regular  medications.  He does have history of heavy drinking.  States he normally drinks alcohol daily and will have up to 10 beers if he is at a game or event.  He denies smoking but states he does chew smokeless tobacco.    Denies family history of GI malignancy.  Denies prior EGD or colonoscopy.  Denies MI/stroke history, is not on any blood thinners.  No record of prior EGD or colonoscopy.  Past Medical History:  Diagnosis Date   GERD (gastroesophageal reflux disease)     No past surgical history on file.  Prior to Admission medications   Medication Sig Start Date End Date Taking? Authorizing Provider  Multiple Vitamin (MULTIVITAMIN WITH MINERALS) TABS tablet Take 1 tablet by mouth daily.   Yes [provider]  ondansetron (ZOFRAN-ODT) 4 MG disintegrating tablet Take 4 mg by mouth every 4 (four) hours as needed for nausea/vomiting. 08/13/21  Yes [provider]  albuterol (VENTOLIN HFA) 108 (90 Base) MCG/ACT inhaler 2 puffs q.i.d. p.r.n. short of breath, wheezing, or cough Patient not taking: Reported on 08/22/2021 11/18/16   [provider]  diclofenac (VOLTAREN) 75 MG EC tablet Take 1 tablet (75 mg total) by mouth 2 (two) times daily. Patient not taking: Reported on 08/22/2021 04/03/21   Wallene Huh, DPM    Scheduled Meds:  folic acid  1 mg Oral Daily   LORazepam  0-4 mg Intravenous Q6H   Followed by   Derrill Memo ON 08/24/2021] LORazepam  0-4 mg Intravenous Q12H  melatonin  5 mg Oral QHS   multivitamin with minerals  1 tablet Oral Daily   pantoprazole (PROTONIX) IV  40 mg Intravenous Q12H   thiamine  100 mg Oral Daily   Or   thiamine  100 mg Intravenous Daily   Continuous Infusions:  dextrose 5 % and 0.45 % NaCl with KCl 20 mEq/L     PRN Meds:.acetaminophen **OR** acetaminophen, albuterol, LORazepam **OR** LORazepam, ondansetron **OR** ondansetron (ZOFRAN) IV  Allergies as of 08/21/2021 - Review Complete 08/21/2021  Allergen Reaction Noted   Ceclor  [cefaclor]  08/21/2021    History reviewed. No pertinent family history.  Social History   Socioeconomic History   Marital status: Single    Spouse name: Not on file   Number of children: Not on file   Years of education: Not on file   Highest education level: Not on file  Occupational History   Not on file  Tobacco Use   Smoking status: Never    Passive exposure: Never   Smokeless tobacco: Never  Vaping Use   Vaping Use: Never used  Substance and Sexual Activity   Alcohol use: Yes   Drug use: Yes    Types: Marijuana   Sexual activity: Not on file  Other Topics Concern   Not on file  Social History Narrative   Not on file   Social Determinants of Health   Financial Resource Strain: Not on file  Food Insecurity: Not on file  Transportation Needs: Not on file  Physical Activity: Not on file  Stress: Not on file  Social Connections: Not on file  Intimate Partner Violence: Not on file    Review of Systems: Review of Systems  Constitutional:  Positive for weight loss (50 pounds in 5 months). Negative for chills and fever.  Eyes:  Negative for discharge and redness.  Respiratory:  Negative for shortness of breath, wheezing and stridor.   Cardiovascular:  Negative for chest pain and leg swelling.  Gastrointestinal:  Negative for abdominal pain, blood in stool, constipation, diarrhea, heartburn, melena, nausea and vomiting.       Dysphagia to solids, regurgitation, history of reflux but not in the last 5 months  Genitourinary:  Negative for dysuria and urgency.  Musculoskeletal:  Negative for falls and joint pain.  Skin:  Negative for itching and rash.  Neurological:  Negative for seizures and loss of consciousness.  Endo/Heme/Allergies:  Negative for environmental allergies. Does not bruise/bleed easily.  Psychiatric/Behavioral:  Negative for memory loss. The patient is not nervous/anxious.     Physical Exam: Physical Exam Constitutional:      Appearance: Normal  appearance.  HENT:     Head: Normocephalic and atraumatic.     Right Ear: External ear normal.     Left Ear: External ear normal.     Nose: Nose normal.     Mouth/Throat:     Mouth: Mucous membranes are moist.     Pharynx: Oropharynx is clear.  Eyes:     Extraocular Movements: Extraocular movements intact.     Conjunctiva/sclera: Conjunctivae normal.  Cardiovascular:     Rate and Rhythm: Normal rate and regular rhythm.     Pulses: Normal pulses.     Heart sounds: Normal heart sounds.  Pulmonary:     Effort: Pulmonary effort is normal.     Breath sounds: Normal breath sounds.  Abdominal:     General: Bowel sounds are normal. There is no distension.     Palpations: Abdomen is soft.  Tenderness: There is no abdominal tenderness.  Musculoskeletal:     Cervical back: Normal range of motion and neck supple.     Right lower leg: No edema.     Left lower leg: No edema.  Skin:    General: Skin is warm and dry.  Neurological:     General: No focal deficit present.     Mental Status: He is alert and oriented to person, place, and time.  Psychiatric:        Mood and Affect: Mood normal.        Behavior: Behavior normal.     Vital signs: Vitals:   08/22/21 0239 08/22/21 0653  BP: (!) 145/105 (!) 131/95  Pulse: (!) 102 100  Resp: 20 20  Temp: 98.6 F (37 C) 98.1 F (36.7 C)  SpO2: 98% 99%   Last BM Date : 08/21/21    GI:  Lab Results: Recent Labs    08/21/21 1820  WBC 6.2  HGB 13.8  HCT 37.1*  PLT 280   BMET Recent Labs    08/21/21 1820 08/22/21 0422  NA 139 136  K 2.1* 2.2*  CL 87* 92*  CO2 39* 35*  GLUCOSE 115* 99  BUN 6 6  CREATININE 0.73 0.73  CALCIUM 8.8* 7.9*   LFT Recent Labs    08/22/21 0422  PROT 5.1*  ALBUMIN 2.9*  AST 79*  ALT 43  ALKPHOS 66  BILITOT 1.8*   PT/INR No results for input(s): LABPROT, INR in the last 72 hours.   Studies/Results: DG Chest 2 View  Result Date: 08/21/2021 CLINICAL DATA:  Nausea, vomiting,  aspiration EXAM: CHEST - 2 VIEW COMPARISON:  None. FINDINGS: The heart size and mediastinal contours are within normal limits. Both lungs are clear. The visualized skeletal structures are unremarkable. IMPRESSION: No active cardiopulmonary disease. Electronically Signed   By: Fidela Salisbury M.D.   On: 08/21/2021 21:53   CT ABDOMEN PELVIS W CONTRAST  Result Date: 08/21/2021 CLINICAL DATA:  Abdominal pain. EXAM: CT ABDOMEN AND PELVIS WITH CONTRAST TECHNIQUE: Multidetector CT imaging of the abdomen and pelvis was performed using the standard protocol following bolus administration of intravenous contrast. RADIATION DOSE REDUCTION: This exam was performed according to the departmental dose-optimization program which includes automated exposure control, adjustment of the mA and/or kV according to patient size and/or use of iterative reconstruction technique. CONTRAST:  163mL OMNIPAQUE IOHEXOL 300 MG/ML  SOLN COMPARISON:  None. FINDINGS: Lower chest: The visualized lung bases are clear. No intra-abdominal free air or free fluid. Hepatobiliary: Severe fatty liver. No intrahepatic biliary dilatation. Gallbladder sludge. No calcified stone or pericholecystic fluid. Pancreas: Unremarkable. No pancreatic ductal dilatation or surrounding inflammatory changes. Spleen: Normal in size without focal abnormality. Adrenals/Urinary Tract: The adrenal glands unremarkable. There is a 3 mm nonobstructing left renal upper pole calculus. No hydronephrosis. The right kidney is unremarkable. The visualized ureters and urinary bladder appear unremarkable. Stomach/Bowel: There is no bowel obstruction or active inflammation. The appendix is normal. Vascular/Lymphatic: The abdominal aorta and IVC unremarkable. No portal venous gas. There is no adenopathy. Reproductive: The prostate and seminal vesicles are grossly unremarkable. No pelvic mass. Other: None Musculoskeletal: No acute or significant osseous findings. IMPRESSION: 1. A 3 mm  nonobstructing left renal upper pole calculus. No hydronephrosis. 2. Severe fatty liver. 3. No bowel obstruction. Normal appendix. Electronically Signed   By: Anner Crete M.D.   On: 08/21/2021 19:44    Impression: Dysphagia to solids; weight loss -Intermittent dysphagia to solids over  the last 5 months with 50 pound weight loss, worsening since Thanksgiving.  Primarily on a diet of protein shakes at this time. - LFTs trending down - BUN <5, Cr. 0.72 - Potassium 2.6 - Hgb 13.8  Fatty liver - CT abdomen and pelvis showed 3 mm nonobstructing left renal upper pole calculus, no hydronephrosis, severe fatty liver with gallbladder sludge.  No calcified stone or pericholecystic fluid  Plan: Plan for EGD tomorrow. I thoroughly discussed the procedures to include nature, alternatives, benefits, and risks including but not limited to bleeding, perforation, infection, anesthesia/cardiac and pulmonary complications. Patient provides understanding and gave verbal consent to proceed. Continue Protonix IV 40mg  BID Continue clear liquid diet NPO at midnight Continue supportive care as needed. [facility] will follow.        LOS: 0 days   Angelique Holm  PA-C 08/22/2021, 9:08 AM  Contact #  [phone]

## 2021-08-22 NOTE — H&P (Signed)
History and Physical    PatientGODFREY Diaz QQV:[id number] DOB: [birth year] DOA: 08/21/2021 DOS: the patient was seen and examined on 08/22/2021 PCP: Everardo Beals, NP  Patient coming from: Home  Chief Complaint:  Chief Complaint  Patient presents with   Weakness    HPI: Henry Diaz is a 31 y.o. male with medical history significant of GERD, alcohol use, presented with nausea, vomiting and dysphagia to solids for 5 months, worsened in the last 2 months.  Patient was evaluated by PCP on 2/22, labs were obtained and was called to go to ED due to profound hypokalemia.   Denied any diarrhea.  Per patient, he started having intermittent dysphagia to solids at least 5 months ago.  He remembers his last good solid meal on Thanksgiving and subsequently his dysphagia continued to worsen.  In the last 2 months, he has not been able to tolerate solid foods and throws up within next 20 minutes after eating solids.  He has been able to take liquids, has been taking protein shakes, Ensure without difficulty.  Denies any abdominal pain, hematochezia, melena or hematemesis.  Patient had his first visit with PCP to work-up his symptoms on 2/22.  He reported tingling in his hands and feet in the last few days, has lost about 50 pounds in the last 5 months.  Review of Systems: As mentioned in the history of present illness. All other systems reviewed and are negative. Past Medical History:  Diagnosis Date   GERD (gastroesophageal reflux disease)    No past surgical history    Social History:  reports that he has never smoked. He has never been exposed to tobacco smoke. He has never used smokeless tobacco. He reports current alcohol use. He reports current drug use. Drug: Marijuana.  Allergies  Allergen Reactions   Ceclor [Cefaclor]     Family history Father passed away from brain cancer Hyperlipidemia runs in the family.  Otherwise no history of colon cancer or GI cancers in the family.  Prior  to Admission medications   Medication Sig Start Date End Date Taking? Authorizing Provider  Multiple Vitamin (MULTIVITAMIN WITH MINERALS) TABS tablet Take 1 tablet by mouth daily.   Yes [provider]  ondansetron (ZOFRAN-ODT) 4 MG disintegrating tablet Take 4 mg by mouth every 4 (four) hours as needed for nausea/vomiting. 08/13/21  Yes [provider]  albuterol (VENTOLIN HFA) 108 (90 Base) MCG/ACT inhaler 2 puffs q.i.d. p.r.n. short of breath, wheezing, or cough Patient not taking: Reported on 08/22/2021 11/18/16   [provider]  diclofenac (VOLTAREN) 75 MG EC tablet Take 1 tablet (75 mg total) by mouth 2 (two) times daily. Patient not taking: Reported on 08/22/2021 04/03/21   Wallene Huh, [place]    Physical Exam: Vitals:   08/21/21 2115 08/22/21 0115 08/22/21 0239 08/22/21 0653  BP: (!) 139/96 (!) 134/91 (!) 145/105 (!) 131/95  Pulse: (!) 103 100 (!) 102 100  Resp: 17 15 20 20   Temp:   98.6 F (37 C) 98.1 F (36.7 C)  TempSrc:   Oral Oral  SpO2: 96% 96% 98% 99%  Weight:      Height:       Physical Exam General: Alert and oriented x 3, NAD, anxious  Cardiovascular: S1 S2 clear, RRR. No pedal edema b/l Respiratory: CTAB, no wheezing, rales or rhonchi Gastrointestinal: Soft, nontender, nondistended, NBS Ext: no pedal edema bilaterally Neuro: no new deficits Skin: No rashes Psych: anxious alert and oriented x3  Data Reviewed: CBC Latest Ref Rng & Units 08/21/2021  WBC 4.0 - 10.5 K/uL 6.2  Hemoglobin 13.0 - 17.0 g/dL 13.8  Hematocrit 39.0 - 52.0 % 37.1(L)  Platelets 150 - 400 K/uL 280    CMP Latest Ref Rng & Units 08/22/2021 08/21/2021  Glucose 70 - 99 mg/dL 99 115(H)  BUN 6 - 20 mg/dL 6 6  Creatinine 0.61 - 1.24 mg/dL 0.73 0.73  Sodium 135 - 145 mmol/L 136 139  Potassium 3.5 - 5.1 mmol/L 2.2(LL) 2.1(LL)  Chloride 98 - 111 mmol/L 92(L) 87(L)  CO2 22 - 32 mmol/L 35(H) 39(H)  Calcium 8.9 - 10.3 mg/dL 7.9(L) 8.8(L)  Total Protein 6.5 - 8.1 g/dL  5.1(L) 5.4(L)  Total Bilirubin 0.3 - 1.2 mg/dL 1.8(H) 1.5(H)  Alkaline Phos 38 - 126 U/L 66 80  AST 15 - 41 U/L 79(H) 87(H)  ALT 0 - 44 U/L 43 47(H)    CT abdomen: Showed severe fatty liver, no intrahepatic biliary dilatation, gallbladder sludge, no calcified stones or pericholecystic fluid.  3 mm nonobstructing left renal upper pole calculus, no hydronephrosis. EKG reviewed, rate 92, normal sinus rhythm, showed prolonged QTc 492  Assessment and Plan: * Hypokalemia- (present on admission) - Presented with profound hypokalemia 2.1, magnesium 1.5, dehydration, dysphagia to solids, worsening in the last 3 months, unable to keep p.o. -Replaced IV and p.o., will recheck potassium -Added D5 half-normal saline with 63meq K at 100 cc an hour  Dysphagia - Per patient, he was having intermittent dysphagia to solids in the last 5-6 months however worsened after Thanksgiving.  Now he has been drinking protein shakes only and unable to keep solids down.  Has lost close to 50 pounds in the last 6 months. - will place on clear liquid diet, IV fluids, GI consulted, discussed with Dr. Paulita Fujita.  Will need endoscopy -Placed on Protonix 40 mg IV every 12 hours  GERD with esophagitis- (present on admission) - Patient does have history of GERD, currently having dysphagia -Will place on IV PPI, GI consulted, will need endoscopy. -Placed on clear liquid diet  Dehydration- (present on admission) - Presented with profound electrolyte abnormalities, hypokalemia, hypomagnesemia, dehydration, poor p.o. intake -Placed on IV fluid hydration, clear liquid diet, GI consulted  Weight loss, non-intentional- (present on admission) - Secondary to dysphagia, has loss close to 50 pounds in the last 6 months, most of it in the last 2 months -GI consulted  Fatty liver- (present on admission) - CT abdomen and pelvis showed 3 mm nonobstructing left renal upper pole calculus, no hydronephrosis, severe fatty liver with  gallbladder sludge.  No calcified stone or pericholecystic fluid -LFTs trending down, currently no abdominal pain. - will check lipid panel  Alcohol use- (present on admission) - Drinks daily, 2-3 beers on the week nights, up to 10 beers on the weekends.  Last drink 3 days ago. -Counseled strongly on alcohol cessation.  Placed on CIWA protocol with Ativan, thiamine, folate.  Prolonged QT interval- (present on admission) - Likely secondary to severe hypokalemia, hypomagnesemia -replaced potassium, magnesium.   -Avoid QT prolonging medications.  Recheck EKG in a.m.   Advance Care Planning:   Code Status: Full Code   Consults: Discussed with gastroenterology, [facility], Dr. Paulita Fujita  Family Communication: Discussed with patient's mother.  Severity of Illness: The appropriate patient status for this patient is OBSERVATION. Observation status is judged to be reasonable and necessary in order to provide the required intensity of service to ensure the patient's safety. The patient's presenting  symptoms, physical exam findings, and initial radiographic and laboratory data in the context of their medical condition is felt to place them at decreased risk for further clinical deterioration. Furthermore, it is anticipated that the patient will be medically stable for discharge from the hospital within 2 midnights of admission.   Author: Estill Cotta, MD 08/22/2021 9:40 AM  For on call review [email or web address].

## 2021-08-22 NOTE — Progress Notes (Signed)
°   08/22/21 0500  Provider Notification  Provider Name/Title Dr. Myna Hidalgo  Date Provider Notified 08/22/21  Time Provider Notified [phone]  Notification Type Page  Notification Reason Critical result  Test performed and critical result potassium 2.2  Date Critical Result Received 08/22/21  Time Critical Result Received 0506  Provider response See new orders  Date of Provider Response 08/22/21  Time of Provider Response [phone]

## 2021-08-22 NOTE — Assessment & Plan Note (Addendum)
-   CT abdomen and pelvis showed 3 mm nonobstructing left renal upper pole calculus, no hydronephrosis, severe fatty liver with gallbladder sludge.  No calcified stone or pericholecystic fluid -LFTs trending down, currently no abdominal pain.  Monitor LFTs intermittently as an outpatient by PCP.

## 2021-08-22 NOTE — Assessment & Plan Note (Addendum)
-   Drinks daily, 2-3 beers on the week nights, up to 10 beers on the weekends.  Last drink 3 days prior to presentation. -Counseled strongly on alcohol cessation -Treated with CIWA protocol with Ativan, thiamine, folate. -Continue few doses of Ativan as needed upon discharge.  Continue thiamine and folate.  Outpatient follow-up with PCP.

## 2021-08-22 NOTE — Assessment & Plan Note (Addendum)
Esophageal ulcer Gastritis/duodenitis -Treated with IV PPI.   -He underwent EGD on 08/23/2021 which showed LA grade C esophagitis with esophageal ulcer along with gastritis and duodenitis.  GI has cleared the patient for discharge on oral Protonix 40 mg twice a day for a month then once a day afterwards and sucralfate.  He is currently tolerating diet with improved potassium.  He will be discharged home today with outpatient follow-up with PCP and GI.

## 2021-08-23 ENCOUNTER — Encounter (HOSPITAL_COMMUNITY): Payer: Self-pay | Admitting: Internal Medicine

## 2021-08-23 ENCOUNTER — Inpatient Hospital Stay (HOSPITAL_COMMUNITY): Payer: BC Managed Care – PPO | Admitting: Certified Registered Nurse Anesthetist

## 2021-08-23 ENCOUNTER — Encounter (HOSPITAL_COMMUNITY)
Admission: EM | Disposition: A | Discharge status: Home / Self Care | Payer: Self-pay | Source: Home / Self Care | Attending: Internal Medicine

## 2021-08-23 DIAGNOSIS — E860 Dehydration: Secondary | ICD-10-CM | POA: Diagnosis not present

## 2021-08-23 DIAGNOSIS — E876 Hypokalemia: Secondary | ICD-10-CM | POA: Diagnosis not present

## 2021-08-23 DIAGNOSIS — R634 Abnormal weight loss: Secondary | ICD-10-CM

## 2021-08-23 DIAGNOSIS — R1310 Dysphagia, unspecified: Secondary | ICD-10-CM | POA: Diagnosis not present

## 2021-08-23 DIAGNOSIS — Z789 Other specified health status: Secondary | ICD-10-CM | POA: Diagnosis not present

## 2021-08-23 DIAGNOSIS — K2100 Gastro-esophageal reflux disease with esophagitis, without bleeding: Principal | ICD-10-CM

## 2021-08-23 DIAGNOSIS — R9431 Abnormal electrocardiogram [ECG] [EKG]: Secondary | ICD-10-CM

## 2021-08-23 DIAGNOSIS — D539 Nutritional anemia, unspecified: Secondary | ICD-10-CM

## 2021-08-23 HISTORY — PX: BIOPSY: SHX5522

## 2021-08-23 HISTORY — PX: ESOPHAGOGASTRODUODENOSCOPY: SHX5428

## 2021-08-23 LAB — CBC
HCT: 32.6 % — ABNORMAL LOW (ref 39.0–52.0)
Hemoglobin: 12 g/dL — ABNORMAL LOW (ref 13.0–17.0)
MCH: 37.9 pg — ABNORMAL HIGH (ref 26.0–34.0)
MCHC: 36.8 g/dL — ABNORMAL HIGH (ref 30.0–36.0)
MCV: 102.8 fL — ABNORMAL HIGH (ref 80.0–100.0)
Platelets: 202 10*3/uL (ref 150–400)
RBC: 3.17 MIL/uL — ABNORMAL LOW (ref 4.22–5.81)
RDW: 16.2 % — ABNORMAL HIGH (ref 11.5–15.5)
WBC: 3.8 10*3/uL — ABNORMAL LOW (ref 4.0–10.5)
nRBC: 0 % (ref 0.0–0.2)

## 2021-08-23 LAB — MAGNESIUM: Magnesium: 2 mg/dL (ref 1.7–2.4)

## 2021-08-23 LAB — COMPREHENSIVE METABOLIC PANEL
ALT: 35 U/L (ref 0–44)
AST: 58 U/L — ABNORMAL HIGH (ref 15–41)
Albumin: 2.8 g/dL — ABNORMAL LOW (ref 3.5–5.0)
Alkaline Phosphatase: 60 U/L (ref 38–126)
Anion gap: 6 (ref 5–15)
BUN: <5 mg/dL — ABNORMAL LOW (ref 6–20)
CO2: 29 mmol/L (ref 22–32)
Calcium: 8 mg/dL — ABNORMAL LOW (ref 8.9–10.3)
Chloride: 100 mmol/L (ref 98–111)
Creatinine, Ser: 0.66 mg/dL (ref 0.61–1.24)
GFR, Estimated: >60 mL/min (ref >60)
Glucose, Bld: 94 mg/dL (ref 70–99)
Potassium: 3.3 mmol/L — ABNORMAL LOW (ref 3.5–5.1)
Sodium: 135 mmol/L (ref 135–145)
Total Bilirubin: 1 mg/dL (ref 0.3–1.2)
Total Protein: 4.9 g/dL — ABNORMAL LOW (ref 6.5–8.1)

## 2021-08-23 SURGERY — EGD (ESOPHAGOGASTRODUODENOSCOPY)
Anesthesia: Monitor Anesthesia Care

## 2021-08-23 MED ORDER — MIDAZOLAM HCL 2 MG/2ML IJ SOLN
INTRAMUSCULAR | Status: DC | PRN
  Administered 2021-08-23: 2 mg via INTRAVENOUS

## 2021-08-23 MED ORDER — LACTATED RINGERS IV SOLN: INTRAVENOUS | Status: DC | PRN

## 2021-08-23 MED ORDER — PROPOFOL 500 MG/50ML IV EMUL
INTRAVENOUS | Status: DC | PRN
  Administered 2021-08-23: 125 ug/kg/min via INTRAVENOUS

## 2021-08-23 MED ORDER — POTASSIUM CHLORIDE 10 MEQ/100ML IV SOLN
10.0000 meq | INTRAVENOUS | Status: AC
  Administered 2021-08-23 (×3): 10 meq via INTRAVENOUS
  Filled 2021-08-23 (×4): qty 100

## 2021-08-23 MED ORDER — MIDAZOLAM HCL 2 MG/2ML IJ SOLN
INTRAMUSCULAR | Status: AC
  Filled 2021-08-23: qty 2

## 2021-08-23 MED ORDER — PROPOFOL 500 MG/50ML IV EMUL
INTRAVENOUS | Status: AC
  Filled 2021-08-23: qty 50

## 2021-08-23 MED ORDER — PROPOFOL 10 MG/ML IV BOLUS
INTRAVENOUS | Status: DC | PRN
  Administered 2021-08-23 (×2): 40 mg via INTRAVENOUS

## 2021-08-23 MED ORDER — SUCRALFATE 1 GM/10ML PO SUSP
1.0000 g | Freq: Three times a day (TID) | ORAL | Status: DC
  Administered 2021-08-23 – 2021-08-24 (×3): 1 g via ORAL
  Filled 2021-08-23 (×3): qty 10

## 2021-08-23 MED ORDER — LORAZEPAM 2 MG/ML IJ SOLN
1.0000 mg | Freq: Once | INTRAMUSCULAR | Status: AC
Start: 2021-08-23 — End: 2021-08-23
  Administered 2021-08-23: 1 mg via INTRAVENOUS
  Filled 2021-08-23: qty 1

## 2021-08-23 NOTE — Anesthesia Preprocedure Evaluation (Addendum)
Anesthesia Evaluation  Patient identified by MRN, date of birth, ID band Patient awake    Reviewed: Allergy & Precautions, NPO status , Patient's Chart, lab work & pertinent test results  Airway Mallampati: I       Dental no notable dental hx.    Pulmonary neg pulmonary ROS,    Pulmonary exam normal        Cardiovascular negative cardio ROS Normal cardiovascular exam     Neuro/Psych negative neurological ROS  negative psych ROS   GI/Hepatic Neg liver ROS, GERD  ,  Endo/Other  negative endocrine ROS  Renal/GU negative Renal ROS  negative genitourinary   Musculoskeletal negative musculoskeletal ROS (+)   Abdominal Normal abdominal exam  (+)   Peds  Hematology  (+) Blood dyscrasia, anemia ,   Anesthesia Other Findings   Reproductive/Obstetrics                            Anesthesia Physical Anesthesia Plan  ASA: 2  Anesthesia Plan: MAC   Post-op Pain Management:    Induction: Intravenous  PONV Risk Score and Plan: 1 and Propofol infusion, TIVA and Midazolam  Airway Management Planned: Natural Airway and Mask  Additional Equipment: None  Intra-op Plan:   Post-operative Plan:   Informed Consent: I have reviewed the patients History and Physical, chart, labs and discussed the procedure including the risks, benefits and alternatives for the proposed anesthesia with the patient or authorized representative who has indicated his/her understanding and acceptance.     Dental advisory given  Plan Discussed with: CRNA  Anesthesia Plan Comments:        Anesthesia Quick Evaluation

## 2021-08-23 NOTE — Assessment & Plan Note (Signed)
Resolved

## 2021-08-23 NOTE — Progress Notes (Signed)
Progress Note   Patient: Henry Diaz DXI:[id number] DOB: 09-15-1990 DOA: 08/21/2021     1 DOS: the patient was seen and examined on 08/23/2021   Brief hospital course: 31 y.o. male with medical history significant of GERD, alcohol use presented with weakness, nausea, vomiting and dysphagia to solids for 5 months.  On presentation, he was found to have profound hypokalemia with potassium of 2.1.  He was started on IV fluids and IV potassium.  GI was consulted for dysphagia.  Assessment and Plan: * Hypokalemia- (present on admission) - Potassium 3.3 this morning.  Continue replacement.  Magnesium is 2 this morning.  Dysphagia - GI following and planning for EGD today.  Continue Protonix.  N.p.o. for now.  GERD with esophagitis- (present on admission) - Continue IV PPI.  GI following.  Dehydration- (present on admission) - Continue IV fluids.  Weight loss, non-intentional- (present on admission) - Secondary to dysphagia, has lost close to 50 pounds in the last 6 months, most of it in the last 2 months   Prolonged QT interval- (present on admission) - Likely secondary to severe hypokalemia, hypomagnesemia -replaced potassium, magnesium.   -Avoid QT prolonging medications.    Fatty liver- (present on admission) - CT abdomen and pelvis showed 3 mm nonobstructing left renal upper pole calculus, no hydronephrosis, severe fatty liver with gallbladder sludge.  No calcified stone or pericholecystic fluid -LFTs trending down, currently no abdominal pain.  Monitor LFTs.   Alcohol use- (present on admission) - Drinks daily, 2-3 beers on the week nights, up to 10 beers on the weekends.  Last drink 3 days prior to presentation. -Counseled strongly on alcohol cessation by admitting hospitalist.  Continue CIWA protocol with Ativan, thiamine, folate.  Hypomagnesemia - Resolved  Macrocytic anemia - Possibly from alcohol abuse.  Hemoglobin stable.     Subjective:  Patient seen and examined  at bedside.  Wife present at bedside.  No overnight fever, seizures, vomiting reported.  Patient received Ativan last night as per the patient's wife and is more sleepy.  Physical Exam: Vitals:   08/22/21 0653 08/22/21 1038 08/23/21 0559 08/23/21 0800  BP: (!) 131/95 (!) 132/91 (!) 130/96 (!) 134/98  Pulse: 100 94 89 93  Resp: 20 16 16 17   Temp: 98.1 F (36.7 C) 98.5 F (36.9 C) 98.3 F (36.8 C) 98.5 F (36.9 C)  TempSrc: Oral Oral  Oral  SpO2: 99% 97% 100% 97%  Weight:      Height:       General: No acute distress, currently on room air ENT/neck: No elevated JVD.  No obvious masses  respiratory: Bilateral decreased breath sounds at bases with some scattered crackles CVS: S1-S2 heard, rate controlled Abdominal: Soft, nontender, nondistended, no organomegaly, bowel sounds heard Extremities: No cyanosis, clubbing, edema CNS: Sleepy, wakes up slightly, answers some questions.  Slow to respond.  No focal neurologic deficit.  Moving extremities. Lymph: No cervical lymphadenopathy Skin: No rashes, lesions, ulcers Psych: Intermittently gets irritated.  Currently no signs of agitation musculoskeletal: No obvious joint deformity/tenderness/swelling   Data Reviewed: I have reviewed patient's investigation myself including blood work and imaging during this hospitalization.  Today: Sodium 135, potassium 3.3, creatinine 0.66, AST of 58, total bili of 1.0, WBC 3.8, hemoglobin 12, MCV 102.8, platelets 202   Family Communication: Wife at bedside  Disposition: Status is: Inpatient Remains inpatient appropriate because: Of severity of illness and need for EGD     Planned Discharge Destination: Home  Author: Aline August, MD 08/23/2021 10:39 AM  For on call review [email or web address].

## 2021-08-23 NOTE — Assessment & Plan Note (Signed)
-   Possibly from alcohol abuse.  Hemoglobin stable.

## 2021-08-23 NOTE — Plan of Care (Signed)
°  Problem: Clinical Measurements: Goal: Will remain free from infection Outcome: Progressing   Problem: Coping: Goal: Level of anxiety will decrease Outcome: Progressing   Problem: Clinical Measurements: Goal: Will remain free from infection Outcome: Progressing   Problem: Coping: Goal: Level of anxiety will decrease Outcome: Progressing

## 2021-08-23 NOTE — Anesthesia Postprocedure Evaluation (Signed)
Anesthesia Post Note  Patient: Henry Diaz  Procedure(s) Performed: ESOPHAGOGASTRODUODENOSCOPY (EGD) BIOPSY     Patient location during evaluation: Endoscopy Anesthesia Type: MAC Level of consciousness: awake Pain management: pain level controlled Vital Signs Assessment: post-procedure vital signs reviewed and stable Respiratory status: spontaneous breathing Cardiovascular status: stable Postop Assessment: no apparent nausea or vomiting Anesthetic complications: no   No notable events documented.  Last Vitals:  Vitals:   08/23/21 1313 08/23/21 1320  BP: (!) 141/93 (!) 151/103  Pulse: (!) 103 (!) 106  Resp: 12 (!) 27  Temp: 36.9 C   SpO2: 99% 98%    Last Pain:  Vitals:   08/23/21 1320  TempSrc:   PainSc: 0-No pain                 John F Salome Arnt

## 2021-08-23 NOTE — Hospital Course (Addendum)
31 y.o. male with medical history significant of GERD, alcohol use presented with weakness, nausea, vomiting and dysphagia to solids for 5 months.  On presentation, he was found to have profound hypokalemia with potassium of 2.1.  He was started on IV fluids and IV potassium.  GI was consulted for dysphagia.  He underwent EGD which showed LA grade C esophagitis with esophageal ulcer along with gastritis and duodenitis.  GI has cleared the patient for discharge on oral Protonix and sucralfate.  He is currently tolerating diet with improved potassium.  He will be discharged home today with outpatient follow-up with PCP and GI.

## 2021-08-23 NOTE — Op Note (Signed)
[facility] Patient Name: Henry Diaz Procedure Date: 08/23/2021 MRN: [id number] Attending MD: Otis Brace , MD Date of Birth: September 02, 1990 CSN: [id number] Age: 31 Admit Type: Inpatient Procedure:                Upper GI endoscopy Indications:              Dysphagia Providers:                Otis Brace, MD, Jeanella Cara, RN,                            Cletis Athens, Technician, Dellie Catholic Referring MD:              Medicines:                Sedation Administered by an Anesthesia Professional Complications:            No immediate complications. Estimated Blood Loss:     Estimated blood loss was minimal. Procedure:                Pre-Anesthesia Assessment:                           - Prior to the procedure, a History and Physical                            was performed, and patient medications and                            allergies were reviewed. The patient's tolerance of                            previous anesthesia was also reviewed. The risks                            and benefits of the procedure and the sedation                            options and risks were discussed with the patient.                            All questions were answered, and informed consent                            was obtained. Prior Anticoagulants: The patient has                            taken no previous anticoagulant or antiplatelet                            agents. ASA Grade Assessment: II - A patient with                            mild systemic disease. After reviewing the risks  and benefits, the patient was deemed in                            satisfactory condition to undergo the procedure.                           After obtaining informed consent, the endoscope was                            passed under direct vision. Throughout the                            procedure, the patient's blood pressure, pulse, and                             oxygen saturations were monitored continuously. The                            [id number]-[id number] ([id number] Olympus endoscope was introduced                            through the mouth, and advanced to the second part                            of duodenum. The upper GI endoscopy was                            accomplished without difficulty. The patient                            tolerated the procedure well. Scope In: Scope Out: Findings:      LA Grade C (one or more mucosal breaks continuous between tops of 2 or       more mucosal folds, less than 75% circumference) esophagitis with no       bleeding was found in the distal esophagus. Biopsies were taken with a       cold forceps for histology.      One superficial esophageal ulcer with no bleeding and no stigmata of       recent bleeding was found in the distal esophagus.      A widely patent Schatzki ring was found at the gastroesophageal junction.      A small hiatal hernia was present.      Bilious fluid was found in the gastric body.      Scattered mild inflammation characterized by congestion (edema) and       erythema was found in the entire examined stomach. Biopsies were taken       with a cold forceps for histology.      The cardia and gastric fundus were normal on retroflexion.      Scattered mild inflammation characterized by congestion (edema) and       erythema was found in the duodenal bulb. Impression:               - LA Grade C esophagitis with no bleeding. Biopsied.                           -  Esophageal ulcer with no bleeding and no stigmata                            of recent bleeding.                           - Widely patent Schatzki ring.                           - Small hiatal hernia.                           - Bilious gastric fluid.                           - Gastritis. Biopsied.                           - Duodenitis. Moderate Sedation:      Moderate (conscious) sedation was personally administered  by an       anesthesia professional. The following parameters were monitored: oxygen       saturation, heart rate, blood pressure, and response to care. Recommendation:           - Return patient to hospital ward for ongoing care.                           - Soft diet.                           - Continue present medications.                           - Use Protonix (pantoprazole) 40 mg PO BID.                           - Use sucralfate tablets 1 gram PO BID.                           - Repeat upper endoscopy in 3 months to check                            healing.                           - Return to GI office in 3 months. Procedure Code(s):        --- Professional ---                           [id number], Esophagogastroduodenoscopy, flexible,                            transoral; with biopsy, single or multiple Diagnosis Code(s):        --- Professional ---                           K20.90, Esophagitis, unspecified without bleeding  K22.10, Ulcer of esophagus without bleeding                           K22.2, Esophageal obstruction                           K44.9, Diaphragmatic hernia without obstruction or                            gangrene                           K29.70, Gastritis, unspecified, without bleeding                           K29.80, Duodenitis without bleeding                           R13.10, Dysphagia, unspecified CPT copyright 2019 American Medical Association. All rights reserved. The codes documented in this report are preliminary and upon coder review may  be revised to meet current compliance requirements. Otis Brace, MD Otis Brace, MD 08/23/2021 1:20:15 PM Number of Addenda: 0

## 2021-08-23 NOTE — Brief Op Note (Signed)
08/21/2021 - 08/23/2021  1:21 PM  PATIENT:  Henry Diaz  31 y.o. male  PRE-OPERATIVE DIAGNOSIS:  Dysphagia, weight loss  POST-OPERATIVE DIAGNOSIS:  esophagitis, esophageal ulcer, gastritis- biopsied   PROCEDURE:  Procedure(s): ESOPHAGOGASTRODUODENOSCOPY (EGD) (N/A) BIOPSY  SURGEON:  Surgeon(s) and Role:    * Cherrelle Plante, MD - Primary  Findings ----------- -EGD showed LA grade C esophagitis, small distal esophageal ulcer, nonobstructive lower esophageal ring, hiatal hernia, gastritis and mild duodenitis. Dilation was not performed because of esophagitis and esophageal ulcer.  Recommendations ------------------------ -Recommend pantoprazole 40 mg twice daily for 2 months followed by pantoprazole 40 mg once a day -Carafate 1 g twice daily for 4 weeks -Recommend repeat EGD in 3 months with possible dilation if needed -Start soft diet -Finding discussed with patient's wife. -Okay to discharge from GI standpoint.  GI will sign off.  Follow-up in GI clinic in 2 to 3 months.  Otis Brace MD, [place] 08/23/2021, 1:22 PM  Contact #  [phone]

## 2021-08-23 NOTE — Transfer of Care (Signed)
Immediate Anesthesia Transfer of Care Note  Patient: Henry Diaz  Procedure(s) Performed: ESOPHAGOGASTRODUODENOSCOPY (EGD) BIOPSY  Patient Location: PACU  Anesthesia Type:MAC  Level of Consciousness: awake and patient cooperative  Airway & Oxygen Therapy: Patient Spontanous Breathing and Patient connected to face mask  Post-op Assessment: Report given to RN and Post -op Vital signs reviewed and stable  Post vital signs: Reviewed and stable  Last Vitals:  Vitals Value Taken Time  BP 141/93 08/23/21 1312  Temp    Pulse 102 08/23/21 1313  Resp 8 08/23/21 1313  SpO2 100 % 08/23/21 1313  Vitals shown include unvalidated device data.  Last Pain:  Vitals:   08/23/21 1247  TempSrc: Temporal  PainSc: 0-No pain      Patients Stated Pain Goal: 0 (44/46/19 0122)  Complications: No notable events documented.

## 2021-08-23 NOTE — Anesthesia Procedure Notes (Signed)
Procedure Name: MAC Date/Time: 08/23/2021 12:52 PM Performed by: Claudia Desanctis, CRNA Pre-anesthesia Checklist: Patient identified, Emergency Drugs available, Suction available and Patient being monitored Patient Re-evaluated:Patient Re-evaluated prior to induction Oxygen Delivery Method: Simple face mask

## 2021-08-23 NOTE — Interval H&P Note (Signed)
History and Physical Interval Note:  08/23/2021 12:54 PM  Henry Diaz  has presented today for surgery, with the diagnosis of Dysphagia, weight loss.  The various methods of treatment have been discussed with the patient and family. After consideration of risks, benefits and other options for treatment, the patient has consented to  Procedure(s): ESOPHAGOGASTRODUODENOSCOPY (EGD) (N/A) as a surgical intervention.  The patient's history has been reviewed, patient examined, no change in status, stable for surgery.  I have reviewed the patient's chart and labs.  Questions were answered to the patient's satisfaction.     Alicja Everitt

## 2021-08-24 DIAGNOSIS — E876 Hypokalemia: Secondary | ICD-10-CM | POA: Diagnosis not present

## 2021-08-24 DIAGNOSIS — R1310 Dysphagia, unspecified: Secondary | ICD-10-CM | POA: Diagnosis not present

## 2021-08-24 DIAGNOSIS — E860 Dehydration: Secondary | ICD-10-CM | POA: Diagnosis not present

## 2021-08-24 DIAGNOSIS — Z789 Other specified health status: Secondary | ICD-10-CM | POA: Diagnosis not present

## 2021-08-24 LAB — COMPREHENSIVE METABOLIC PANEL
ALT: 32 U/L (ref 0–44)
AST: 55 U/L — ABNORMAL HIGH (ref 15–41)
Albumin: 2.7 g/dL — ABNORMAL LOW (ref 3.5–5.0)
Alkaline Phosphatase: 60 U/L (ref 38–126)
Anion gap: 5 (ref 5–15)
BUN: <5 mg/dL — ABNORMAL LOW (ref 6–20)
CO2: 28 mmol/L (ref 22–32)
Calcium: 8 mg/dL — ABNORMAL LOW (ref 8.9–10.3)
Chloride: 104 mmol/L (ref 98–111)
Creatinine, Ser: 0.67 mg/dL (ref 0.61–1.24)
GFR, Estimated: >60 mL/min (ref >60)
Glucose, Bld: 100 mg/dL — ABNORMAL HIGH (ref 70–99)
Potassium: 3.5 mmol/L (ref 3.5–5.1)
Sodium: 137 mmol/L (ref 135–145)
Total Bilirubin: 0.8 mg/dL (ref 0.3–1.2)
Total Protein: 4.9 g/dL — ABNORMAL LOW (ref 6.5–8.1)

## 2021-08-24 LAB — CBC WITH DIFFERENTIAL/PLATELET
Abs Immature Granulocytes: 0.01 10*3/uL (ref 0.00–0.07)
Basophils Absolute: 0 10*3/uL (ref 0.0–0.1)
Basophils Relative: 1 %
Eosinophils Absolute: 0.1 10*3/uL (ref 0.0–0.5)
Eosinophils Relative: 2 %
HCT: 33.1 % — ABNORMAL LOW (ref 39.0–52.0)
Hemoglobin: 11.8 g/dL — ABNORMAL LOW (ref 13.0–17.0)
Immature Granulocytes: 0 %
Lymphocytes Relative: 35 %
Lymphs Abs: 1.5 10*3/uL (ref 0.7–4.0)
MCH: 37.3 pg — ABNORMAL HIGH (ref 26.0–34.0)
MCHC: 35.6 g/dL (ref 30.0–36.0)
MCV: 104.7 fL — ABNORMAL HIGH (ref 80.0–100.0)
Monocytes Absolute: 0.4 10*3/uL (ref 0.1–1.0)
Monocytes Relative: 8 %
Neutro Abs: 2.4 10*3/uL (ref 1.7–7.7)
Neutrophils Relative %: 54 %
Platelets: 196 10*3/uL (ref 150–400)
RBC: 3.16 MIL/uL — ABNORMAL LOW (ref 4.22–5.81)
RDW: 16.2 % — ABNORMAL HIGH (ref 11.5–15.5)
WBC: 4.4 10*3/uL (ref 4.0–10.5)
nRBC: 0 % (ref 0.0–0.2)

## 2021-08-24 LAB — MAGNESIUM: Magnesium: 1.8 mg/dL (ref 1.7–2.4)

## 2021-08-24 MED ORDER — SUCRALFATE 1 GM/10ML PO SUSP
1.0000 g | Freq: Three times a day (TID) | ORAL | 1 refills | Status: DC
Start: 2021-08-24 — End: 2022-02-23

## 2021-08-24 MED ORDER — THIAMINE HCL 100 MG PO TABS: 100.0000 mg | ORAL_TABLET | Freq: Every day | ORAL | 0 refills | Status: AC

## 2021-08-24 MED ORDER — PANTOPRAZOLE SODIUM 40 MG PO TBEC: DELAYED_RELEASE_TABLET | ORAL | 1 refills | Status: DC

## 2021-08-24 MED ORDER — POTASSIUM CHLORIDE CRYS ER 20 MEQ PO TBCR: 20.0000 meq | EXTENDED_RELEASE_TABLET | Freq: Every day | ORAL | 0 refills | Status: DC

## 2021-08-24 MED ORDER — FOLIC ACID 1 MG PO TABS: 1.0000 mg | ORAL_TABLET | Freq: Every day | ORAL | 0 refills | Status: DC

## 2021-08-24 MED ORDER — POTASSIUM CHLORIDE CRYS ER 20 MEQ PO TBCR
40.0000 meq | EXTENDED_RELEASE_TABLET | Freq: Once | ORAL | Status: AC
  Administered 2021-08-24: 40 meq via ORAL
  Filled 2021-08-24: qty 2

## 2021-08-24 MED ORDER — LORAZEPAM 0.5 MG PO TABS: ORAL_TABLET | ORAL | 0 refills | Status: DC

## 2021-08-24 NOTE — Discharge Instructions (Addendum)
Outpatient Substance Use Treatment Services     Health Outpatient    [facility] Intensive Outpatient Program   [address] [place], [place] [address]    [phone]   Private insurance, Medicare A&B, and [place]    ADS (Alcohol and Drug Services)    [address]    [place], [place] [address]   [phone]   Medicaid, [place] [address]    [place], [place]   Medicaid and [facility], Self Pay    [facility]   [address]    [place], [place]   [facility], and Self Pay   [facility] [place]      [place], [place] [address]    [phone] or [place] [facility]   [address]    [place], [place]   [phone]   Medicaid, Medicare, [place] at the [facility]   [address] [place], [place] [address]   [phone]   Services are free or reduced      [facility]    [address]   [phone]    Self Pay only, sliding scale      [facility]    [address]    [place], [place] [address]   [phone]   ([facility])   Self Pay, Medicaid Only       [facility]   [place], [place] [address]   [phone]   Medicaid, Medicare, [place] [facility]   Residential Substance Use Treatment Services    [facility] ([place]    [address]    [address] [place], [place] [address]    [phone] or [phone]   Detox (Medicare, Medicaid, private insurance, and self pay)    Residential Rehab 14 days (Medicare, [place], private insurance, and self pay)    RTS (Residential Treatment Services)     [place], [place]    Male and Male Detox (Self Pay and Medicaid limited availability)    Rehab only Male ([place] and self pay only)    Fellowship [address]        [address]    [place], [place] [address]    [phone] or [phone]   Detox and [place] Only      [place]    [place].    [place], [place] [address]    [phone]    Treatment Only, must make assessment appointment, and must be sober for assessment appointment.    Self Pay Only, Medicare A&B, [facility], [place] ID only!   *Transportation assistance offered from [place] on [place], [place] [address]   Walk in interviews M-Sat 8-4p   No pending legal charges   [phone]   ADATC:  [facility]   Referral    [address]   [place], [place]   (Self Pay,  Medicaid)      [facility]   [address]   [place], [place] [address]   [phone]   Detox and Residential Treatment   Medicare and [place]   [place].    [place], [place] [address]   [place]: [place]: [phone]   Long-term Residential Program:    [phone] Males 25 and Over   (No Insurance, upfront fee)      [place], [place] [address]   [phone]   [place] with [place], [place]   [place], [place] [address]  Local?[phone]   Private Insurance Only      [place]   [place].    [place], [place] [address]    [phone]   (Males, upfront fee)      [place] of [place]    [place], [place]   [place] Locations   [facility]    [address]    [place], [place]   [place] Based Program for individuals experiencing homelessness   Self Pay, No insurance      [place]    Mens program: [facility]   [address] [place], [place] [address]   [phone]      [place] program: [facility]   [address]  [place], [place] [address]   [phone]   Christian Based Program for individuals experiencing homelessness   Self Pay, No insurance      [place]   [place], [place] [address]    [place] Based Program for individuals experiencing homelessness   Self Pay, No insurance      [place], [place] [address]   [place] for individuals experiencing homelessness   Self Pay, No insurance      [facility]   [place], [place] Based Program for males experiencing homelessness   Self Pay, No insurance

## 2021-08-24 NOTE — TOC Transition Note (Signed)
Transition of Care [facility]) - CM/SW Discharge Note   Patient Details  Name: Henry Diaz MRN: [id number] Date of Birth: Feb 23, 1991  Transition of [facility]) CM/SW Contact:  Ross Ludwig, LCSW Phone Number: 08/24/2021, 11:03 AM   Clinical Narrative:     CSW received consult that patient needed substance abuse resources.  CSW added to patient's AVS, CSW signing off, please reconsult with social work needs.         Patient Goals and CMS Choice        Discharge Placement                       Discharge Plan and Services                                     Social Determinants of Health (SDOH) Interventions     Readmission Risk Interventions No flowsheet data found.

## 2021-08-24 NOTE — Discharge Summary (Signed)
Physician Discharge Summary   Patient: Henry Diaz MRN: [id number] DOB: [birth year]  Admit date:     08/21/2021  Discharge date: 08/24/21  Discharge Physician: Aline August   PCP: Everardo Beals, NP   Recommendations at discharge:   Follow-up with PCP within a week with repeat CBC/CMP Outpatient follow-up with GI Abstain from alcohol Follow-up in the ED if symptoms worsen or new.   Hospital Course: 31 y.o. male with medical history significant of GERD, alcohol use presented with weakness, nausea, vomiting and dysphagia to solids for 5 months.  On presentation, he was found to have profound hypokalemia with potassium of 2.1.  He was started on IV fluids and IV potassium.  GI was consulted for dysphagia.  He underwent EGD which showed LA grade C esophagitis with esophageal ulcer along with gastritis and duodenitis.  GI has cleared the patient for discharge on oral Protonix and sucralfate.  He is currently tolerating diet with improved potassium.  He will be discharged home today with outpatient follow-up with PCP and GI.  Discharge diagnosis and assessment and Plan: * Hypokalemia- (present on admission) - Treated with aggressive oral and IV replacement.  Potassium 3.5 this morning.  Will give K Dur 40 mEq 1 dose today. -Continue supplementation with oral potassium upon discharge with outpatient follow-up of BMP within a week by PCP.  Encourage oral intake.  Dysphagia - Status post EGD.  Findings as below.  Currently tolerating diet.  Outpatient follow-up with GI.  GERD with esophagitis- (present on admission) Esophageal ulcer Gastritis/duodenitis -Treated with IV PPI.   -He underwent EGD on 08/23/2021 which showed LA grade C esophagitis with esophageal ulcer along with gastritis and duodenitis.  GI has cleared the patient for discharge on oral Protonix 40 mg twice a day for a month then once a day afterwards and sucralfate.  He is currently tolerating diet with improved potassium.   He will be discharged home today with outpatient follow-up with PCP and GI.  Dehydration- (present on admission) - Treated with IV fluids.  Improved.  Weight loss, non-intentional- (present on admission) - Secondary to dysphagia, has lost close to 50 pounds in the last 6 months, most of it in the last 2 months   Prolonged QT interval- (present on admission) - Likely secondary to severe hypokalemia, hypomagnesemia -replaced potassium, magnesium.   -Avoid QT prolonging medications.    Fatty liver- (present on admission) - CT abdomen and pelvis showed 3 mm nonobstructing left renal upper pole calculus, no hydronephrosis, severe fatty liver with gallbladder sludge.  No calcified stone or pericholecystic fluid -LFTs trending down, currently no abdominal pain.  Monitor LFTs intermittently as an outpatient by PCP.    Alcohol use- (present on admission) - Drinks daily, 2-3 beers on the week nights, up to 10 beers on the weekends.  Last drink 3 days prior to presentation. -Counseled strongly on alcohol cessation -Treated with CIWA protocol with Ativan, thiamine, folate. -Continue few doses of Ativan as needed upon discharge.  Continue thiamine and folate.  Outpatient follow-up with PCP.  Hypomagnesemia - Resolved  Macrocytic anemia - Possibly from alcohol abuse.  Hemoglobin stable.           Consultants: GI Procedures performed: EGD on 08/23/2021 Disposition: Home Diet recommendation:  Discharge Diet Orders (From admission, onward)     Start     Ordered   08/24/21 0000  Diet general       Comments: Soft diet for 1-2 weeks   08/24/21 0998  Soft diet for 1 to 2 weeks  DISCHARGE MEDICATION: Allergies as of 08/24/2021       Reactions   Ceclor [cefaclor]         Medication List     STOP taking these medications    diclofenac 75 MG EC tablet Commonly known as: VOLTAREN       TAKE these medications    albuterol 108 (90 Base) MCG/ACT  inhaler Commonly known as: VENTOLIN HFA 2 puffs q.i.d. p.r.n. short of breath, wheezing, or cough   folic acid 1 MG tablet Commonly known as: FOLVITE Take 1 tablet (1 mg total) by mouth daily.   LORazepam 0.5 MG tablet Commonly known as: Ativan TID PRN x 1days, BID PRN x 2 days, then daily PRN for anxiety/insomnia x 3days   multivitamin with minerals Tabs tablet Take 1 tablet by mouth daily.   ondansetron 4 MG disintegrating tablet Commonly known as: ZOFRAN-ODT Take 4 mg by mouth every 4 (four) hours as needed for nausea/vomiting.   pantoprazole 40 MG tablet Commonly known as: Protonix BID for 1 month then once a day onwards   potassium chloride SA 20 MEQ tablet Commonly known as: KLOR-CON M Take 1 tablet (20 mEq total) by mouth daily.   sucralfate 1 GM/10ML suspension Commonly known as: CARAFATE Take 10 mLs (1 g total) by mouth 4 (four) times daily -  with meals and at bedtime.   thiamine 100 MG tablet Take 1 tablet (100 mg total) by mouth daily.        Follow-up Information     Gastroenterology, [place]. Schedule an appointment as soon as possible for a visit in 3 month(s).   Why: Follow-up for esophagitis and dysphagia Contact information: [place] [address] [phone]         Everardo Beals, NP. Schedule an appointment as soon as possible for a visit in 1 week(s).   Contact information: [address] [place] [address] [phone]               Subjective: -Patient seen and examined at bedside.  Feels much better and tolerating diet.  Denies any current nausea or vomiting.  Wants to go home today.  Denies worsening abdominal pain, fever   Discharge Exam: Filed Weights   08/21/21 1755 08/23/21 1247  Weight: 93.4 kg 93.4 kg   Vitals:   08/23/21 2152 08/24/21 0637  BP: (!) 128/101 (!) 134/95  Pulse: (!) 106 97  Resp: 16 16  Temp: 98.6 F (37 C) 99.3 F (37.4 C)  SpO2: 99% 97%    General: On  room air.  No distress.   respiratory: Decreased breath sounds at bases bilaterally CVS: Currently rate controlled; S1-S2 heard  abdominal: Soft, nontender, slightly distended, no organomegaly, normal bowel sounds are heard  extremities: No cyanosis, clubbing, edema    Condition at discharge: good  The results of significant diagnostics from this hospitalization (including imaging, microbiology, ancillary and laboratory) are listed below for reference.   Imaging Studies: DG Chest 2 View  Result Date: 08/21/2021 CLINICAL DATA:  Nausea, vomiting, aspiration EXAM: CHEST - 2 VIEW COMPARISON:  None. FINDINGS: The heart size and mediastinal contours are within normal limits. Both lungs are clear. The visualized skeletal structures are unremarkable. IMPRESSION: No active cardiopulmonary disease. Electronically Signed   By: Fidela Salisbury M.D.   On: 08/21/2021 21:53   CT ABDOMEN PELVIS W CONTRAST  Result Date: 08/21/2021 CLINICAL DATA:  Abdominal pain. EXAM: CT  ABDOMEN AND PELVIS WITH CONTRAST TECHNIQUE: Multidetector CT imaging of the abdomen and pelvis was performed using the standard protocol following bolus administration of intravenous contrast. RADIATION DOSE REDUCTION: This exam was performed according to the departmental dose-optimization program which includes automated exposure control, adjustment of the mA and/or kV according to patient size and/or use of iterative reconstruction technique. CONTRAST:  182mL OMNIPAQUE IOHEXOL 300 MG/ML  SOLN COMPARISON:  None. FINDINGS: Lower chest: The visualized lung bases are clear. No intra-abdominal free air or free fluid. Hepatobiliary: Severe fatty liver. No intrahepatic biliary dilatation. Gallbladder sludge. No calcified stone or pericholecystic fluid. Pancreas: Unremarkable. No pancreatic ductal dilatation or surrounding inflammatory changes. Spleen: Normal in size without focal abnormality. Adrenals/Urinary Tract: The adrenal glands unremarkable.  There is a 3 mm nonobstructing left renal upper pole calculus. No hydronephrosis. The right kidney is unremarkable. The visualized ureters and urinary bladder appear unremarkable. Stomach/Bowel: There is no bowel obstruction or active inflammation. The appendix is normal. Vascular/Lymphatic: The abdominal aorta and IVC unremarkable. No portal venous gas. There is no adenopathy. Reproductive: The prostate and seminal vesicles are grossly unremarkable. No pelvic mass. Other: None Musculoskeletal: No acute or significant osseous findings. IMPRESSION: 1. A 3 mm nonobstructing left renal upper pole calculus. No hydronephrosis. 2. Severe fatty liver. 3. No bowel obstruction. Normal appendix. Electronically Signed   By: Anner Crete M.D.   On: 08/21/2021 19:44    Microbiology: Results for orders placed or performed during the hospital encounter of 08/21/21  Resp Panel by RT-PCR (Flu A&B, Covid) Nasopharyngeal Swab     Status: None   Collection Time: 08/21/21  9:04 PM   Specimen: Nasopharyngeal Swab; Nasopharyngeal(NP) swabs in vial transport medium  Result Value Ref Range Status   SARS Coronavirus 2 by RT PCR NEGATIVE NEGATIVE Final    Comment: (NOTE) SARS-CoV-2 target nucleic acids are NOT DETECTED.  The SARS-CoV-2 RNA is generally detectable in upper respiratory specimens during the acute phase of infection. The lowest concentration of SARS-CoV-2 viral copies this assay can detect is 138 copies/mL. A negative result does not preclude SARS-Cov-2 infection and should not be used as the sole basis for treatment or other patient management decisions. A negative result may occur with  improper specimen collection/handling, submission of specimen other than nasopharyngeal swab, presence of viral mutation(s) within the areas targeted by this assay, and inadequate number of viral copies(<138 copies/mL). A negative result must be combined with clinical observations, patient history, and  epidemiological information. The expected result is Negative.  Fact Sheet for Patients:  EntrepreneurPulse.com.au  Fact Sheet for Healthcare Providers:  IncredibleEmployment.be  This test is no t yet approved or cleared by the [place] FDA and  has been authorized for detection and/or diagnosis of SARS-CoV-2 by FDA under an Emergency Use Authorization (EUA). This EUA will remain  in effect (meaning this test can be used) for the duration of the COVID-19 declaration under Section 564(b)(1) of the Act, 21 U.S.C.section 360bbb-3(b)(1), unless the authorization is terminated  or revoked sooner.       Influenza A by PCR NEGATIVE NEGATIVE Final   Influenza B by PCR NEGATIVE NEGATIVE Final    Comment: (NOTE) The Xpert Xpress SARS-CoV-2/FLU/RSV plus assay is intended as an aid in the diagnosis of influenza from Nasopharyngeal swab specimens and should not be used as a sole basis for treatment. Nasal washings and aspirates are unacceptable for Xpert Xpress SARS-CoV-2/FLU/RSV testing.  Fact Sheet for Patients: EntrepreneurPulse.com.au  Fact Sheet for Healthcare Providers: IncredibleEmployment.be  This test is not yet approved or cleared by the [place] and has been authorized for detection and/or diagnosis of SARS-CoV-2 by FDA under an Emergency Use Authorization (EUA). This EUA will remain in effect (meaning this test can be used) for the duration of the COVID-19 declaration under Section 564(b)(1) of the Act, 21 U.S.C. section 360bbb-3(b)(1), unless the authorization is terminated or revoked.  Performed at [facility], [address] [place], [place] [address]     Labs: CBC: Recent Labs  Lab 08/21/21 1820 08/23/21 0751 08/24/21 0422  WBC 6.2 3.8* 4.4  NEUTROABS 4.0  --  2.4  HGB 13.8 12.0* 11.8*  HCT 37.1* 32.6* 33.1*  MCV 97.6 102.8* 104.7*  PLT 280 202 321    Basic Metabolic Panel: Recent Labs  Lab 08/21/21 1820 08/22/21 0422 08/22/21 0921 08/22/21 1747 08/22/21 2034 08/23/21 0743 08/23/21 0751 08/24/21 0422  NA 139 136 137  --   --   --  135 137  K 2.1* 2.2* 2.6* 2.7* 2.6*  --  3.3* 3.5  CL 87* 92* 91*  --   --   --  100 104  CO2 39* 35* 37*  --   --   --  29 28  GLUCOSE 115* 99 102*  --   --   --  94 100*  BUN 6 6 <5*  --   --   --  <5* <5*  CREATININE 0.73 0.73 0.72  --   --   --  0.66 0.67  CALCIUM 8.8* 7.9* 8.1*  --   --   --  8.0* 8.0*  MG 1.5* 2.2  --   --   --  2.0  --  1.8  PHOS  --  2.8  --   --   --   --   --   --    Liver Function Tests: Recent Labs  Lab 08/21/21 1820 08/22/21 0422 08/23/21 0751 08/24/21 0422  AST 87* 79* 58* 55*  ALT 47* 43 35 32  ALKPHOS 80 66 60 60  BILITOT 1.5* 1.8* 1.0 0.8  PROT 5.4* 5.1* 4.9* 4.9*  ALBUMIN 3.6 2.9* 2.8* 2.7*   CBG: No results for input(s): GLUCAP in the last 168 hours.  Discharge time spent: greater than 30 minutes.  Signed: Aline August, MD [facility] 08/24/2021

## 2021-08-24 NOTE — Plan of Care (Signed)
°  Problem: Health Behavior/Discharge Planning: Goal: Ability to manage health-related needs will improve Outcome: Adequate for Discharge   Problem: Clinical Measurements: Goal: Ability to maintain clinical measurements within normal limits will improve Outcome: Adequate for Discharge   Problem: Clinical Measurements: Goal: Will remain free from infection Outcome: Adequate for Discharge   Problem: Activity: Goal: Risk for activity intolerance will decrease Outcome: Adequate for Discharge   Problem: Nutrition: Goal: Adequate nutrition will be maintained Outcome: Adequate for Discharge

## 2021-08-25 ENCOUNTER — Encounter (HOSPITAL_COMMUNITY): Payer: Self-pay | Admitting: Gastroenterology

## 2021-08-27 LAB — SURGICAL PATHOLOGY

## 2021-09-04 ENCOUNTER — Emergency Department (HOSPITAL_COMMUNITY): Payer: BC Managed Care – PPO

## 2021-09-04 ENCOUNTER — Other Ambulatory Visit: Payer: Self-pay

## 2021-09-04 ENCOUNTER — Emergency Department (HOSPITAL_COMMUNITY)
Admission: EM | Admit: 2021-09-04 | Discharge: 2021-09-05 | Disposition: A | Discharge status: Home / Self Care | Payer: BC Managed Care – PPO | Attending: Emergency Medicine | Admitting: Emergency Medicine

## 2021-09-04 ENCOUNTER — Encounter (HOSPITAL_COMMUNITY): Payer: Self-pay | Admitting: Emergency Medicine

## 2021-09-04 DIAGNOSIS — I159 Secondary hypertension, unspecified: Secondary | ICD-10-CM | POA: Diagnosis not present

## 2021-09-04 DIAGNOSIS — E876 Hypokalemia: Secondary | ICD-10-CM | POA: Diagnosis not present

## 2021-09-04 DIAGNOSIS — R531 Weakness: Secondary | ICD-10-CM | POA: Insufficient documentation

## 2021-09-04 DIAGNOSIS — R200 Anesthesia of skin: Secondary | ICD-10-CM | POA: Diagnosis not present

## 2021-09-04 LAB — CBC WITH DIFFERENTIAL/PLATELET
Abs Immature Granulocytes: 0.01 10*3/uL (ref 0.00–0.07)
Basophils Absolute: 0.1 10*3/uL (ref 0.0–0.1)
Basophils Relative: 1 %
Eosinophils Absolute: 0 10*3/uL (ref 0.0–0.5)
Eosinophils Relative: 1 %
HCT: 43.4 % (ref 39.0–52.0)
Hemoglobin: 15.3 g/dL (ref 13.0–17.0)
Immature Granulocytes: 0 %
Lymphocytes Relative: 22 %
Lymphs Abs: 1.2 10*3/uL (ref 0.7–4.0)
MCH: 36 pg — ABNORMAL HIGH (ref 26.0–34.0)
MCHC: 35.3 g/dL (ref 30.0–36.0)
MCV: 102.1 fL — ABNORMAL HIGH (ref 80.0–100.0)
Monocytes Absolute: 0.4 10*3/uL (ref 0.1–1.0)
Monocytes Relative: 6 %
Neutro Abs: 4 10*3/uL (ref 1.7–7.7)
Neutrophils Relative %: 70 %
Platelets: 348 10*3/uL (ref 150–400)
RBC: 4.25 MIL/uL (ref 4.22–5.81)
RDW: 13.6 % (ref 11.5–15.5)
WBC: 5.7 10*3/uL (ref 4.0–10.5)
nRBC: 0 % (ref 0.0–0.2)

## 2021-09-04 LAB — COMPREHENSIVE METABOLIC PANEL
ALT: 65 U/L — ABNORMAL HIGH (ref 0–44)
AST: 138 U/L — ABNORMAL HIGH (ref 15–41)
Albumin: 3.3 g/dL — ABNORMAL LOW (ref 3.5–5.0)
Alkaline Phosphatase: 54 U/L (ref 38–126)
Anion gap: 8 (ref 5–15)
BUN: <5 mg/dL — ABNORMAL LOW (ref 6–20)
CO2: 24 mmol/L (ref 22–32)
Calcium: 8.3 mg/dL — ABNORMAL LOW (ref 8.9–10.3)
Chloride: 108 mmol/L (ref 98–111)
Creatinine, Ser: 0.83 mg/dL (ref 0.61–1.24)
GFR, Estimated: >60 mL/min (ref >60)
Glucose, Bld: 112 mg/dL — ABNORMAL HIGH (ref 70–99)
Potassium: 3.4 mmol/L — ABNORMAL LOW (ref 3.5–5.1)
Sodium: 140 mmol/L (ref 135–145)
Total Bilirubin: 0.5 mg/dL (ref 0.3–1.2)
Total Protein: 5.7 g/dL — ABNORMAL LOW (ref 6.5–8.1)

## 2021-09-04 LAB — CSF CELL COUNT WITH DIFFERENTIAL
RBC Count, CSF: 0 /mm3
RBC Count, CSF: 1 /mm3 — ABNORMAL HIGH
Tube #: 1
Tube #: 4
WBC, CSF: 1 /mm3 (ref 0–5)
WBC, CSF: 1 /mm3 (ref 0–5)

## 2021-09-04 LAB — GLUCOSE, CSF: Glucose, CSF: 62 mg/dL (ref 40–70)

## 2021-09-04 LAB — MAGNESIUM: Magnesium: 1.9 mg/dL (ref 1.7–2.4)

## 2021-09-04 LAB — BASIC METABOLIC PANEL
Anion gap: 12 (ref 5–15)
BUN: <5 mg/dL — ABNORMAL LOW (ref 6–20)
CO2: 21 mmol/L — ABNORMAL LOW (ref 22–32)
Calcium: 9.1 mg/dL (ref 8.9–10.3)
Chloride: 105 mmol/L (ref 98–111)
Creatinine, Ser: 0.64 mg/dL (ref 0.61–1.24)
GFR, Estimated: >60 mL/min (ref >60)
Glucose, Bld: 138 mg/dL — ABNORMAL HIGH (ref 70–99)
Potassium: 3 mmol/L — ABNORMAL LOW (ref 3.5–5.1)
Sodium: 138 mmol/L (ref 135–145)

## 2021-09-04 LAB — PROTEIN, CSF: Total  Protein, CSF: 43 mg/dL (ref 15–45)

## 2021-09-04 LAB — VITAMIN B12: Vitamin B-12: 220 pg/mL (ref 180–914)

## 2021-09-04 LAB — ETHANOL: Alcohol, Ethyl (B): <10 mg/dL (ref <10)

## 2021-09-04 LAB — FOLATE: Folate: 6.7 ng/mL (ref >5.9)

## 2021-09-04 MED ORDER — LORAZEPAM 2 MG/ML IJ SOLN: 0.5000 mg | Freq: Once | INTRAMUSCULAR | Status: AC

## 2021-09-04 MED ORDER — POTASSIUM CHLORIDE CRYS ER 20 MEQ PO TBCR
40.0000 meq | EXTENDED_RELEASE_TABLET | Freq: Once | ORAL | Status: AC
  Administered 2021-09-04: 21:00:00 40 meq via ORAL
  Filled 2021-09-04: qty 2

## 2021-09-04 MED ORDER — SODIUM CHLORIDE 0.9 % IV SOLN: INTRAVENOUS | Status: DC

## 2021-09-04 MED ORDER — POTASSIUM CHLORIDE 10 MEQ/100ML IV SOLN
10.0000 meq | Freq: Once | INTRAVENOUS | Status: AC
  Administered 2021-09-04: 16:00:00 10 meq via INTRAVENOUS
  Filled 2021-09-04: qty 100

## 2021-09-04 MED ORDER — LORAZEPAM 2 MG/ML IJ SOLN
INTRAMUSCULAR | Status: AC
  Administered 2021-09-04: 18:00:00 0.5 mg via INTRAVENOUS
  Filled 2021-09-04: qty 1

## 2021-09-04 MED ORDER — GADOBUTROL 1 MMOL/ML IV SOLN
10.0000 mL | Freq: Once | INTRAVENOUS | Status: AC | PRN
  Administered 2021-09-04: 19:00:00 10 mL via INTRAVENOUS

## 2021-09-04 NOTE — ED Provider Notes (Signed)
[facility] DEPT Provider Note   CSN: [id number] Arrival date & time: 09/04/21  1205     History  Chief Complaint  Patient presents with   Numbness    Henry Diaz is a 31 y.o. male.  HPI Patient here for evaluation of progressive numbness and weakness.  He describes beginning to feel numb, on Thanksgiving last year, about 2 weeks after having influenza vaccine.  Numbness was initially in his toes then has gradually risen to his mid chest level.  He also has numbness in his arms and hands.  He has noticed decrease penile erection strength and has difficulty with sexual intercourse because of that.  He has had some transient numbness of his lips which are currently better.  He feels generally weak and has difficulty climbing stairs.  His symptoms have been fairly static but possibly progressed somewhat over the last couple of weeks.  He was hospitalized, on 08/21/2021.  At that time he was diagnosed with hypokalemia.  His symptoms at that time were attributed to the metabolic disorder.  Since then he has been following with his PCP who has checked his potassium and found to be normal.  He is taking potassium and Protonix.  He feels that he was hypokalemic because of ulcers and poor oral intake.  During the admission he was evaluated with EGD and found to have esophagitis and esophageal ulcer with gastritis and duodenitis.  He had secondary prolonged QT and weight loss.  During the hospitalization he was treated with magnesium for hypomagnesemia.  He states he is not eating well, normally, and not vomiting or having diarrhea.  He denies fever or chills.    Home Medications Prior to Admission medications   Medication Sig Start Date End Date Taking? Authorizing Provider  albuterol (VENTOLIN HFA) 108 (90 Base) MCG/ACT inhaler 2 puffs q.i.d. p.r.n. short of breath, wheezing, or cough Patient not taking: Reported on 08/22/2021 11/18/16   [provider]  folic  acid (FOLVITE) 1 MG tablet Take 1 tablet (1 mg total) by mouth daily. 08/24/21   Aline August, MD  LORazepam (ATIVAN) 0.5 MG tablet TID PRN x 1days, BID PRN x 2 days, then daily PRN for anxiety/insomnia x 3days 08/24/21   Aline August, MD  Multiple Vitamin (MULTIVITAMIN WITH MINERALS) TABS tablet Take 1 tablet by mouth daily.    [provider]  ondansetron (ZOFRAN-ODT) 4 MG disintegrating tablet Take 4 mg by mouth every 4 (four) hours as needed for nausea/vomiting. 08/13/21   [provider]  pantoprazole (PROTONIX) 40 MG tablet BID for 1 month then once a day onwards 08/24/21   Aline August, MD  potassium chloride SA (KLOR-CON M) 20 MEQ tablet Take 1 tablet (20 mEq total) by mouth daily. 08/24/21   Aline August, MD  sucralfate (CARAFATE) 1 GM/10ML suspension Take 10 mLs (1 g total) by mouth 4 (four) times daily -  with meals and at bedtime. 08/24/21   Aline August, MD  thiamine 100 MG tablet Take 1 tablet (100 mg total) by mouth daily. 08/24/21   Aline August, MD      Allergies    Ceclor [cefaclor]    Review of Systems   Review of Systems  Physical Exam Updated Vital Signs BP (!) 153/113    Pulse (!) 118    Temp 98.8 F (37.1 C) (Oral)    Resp (!) 22    Ht '6\' 1"'$  (1.854 m)    Wt 93.4 kg  SpO2 97%    BMI 27.17 kg/m  Physical Exam  ED Results / Procedures / Treatments   Labs (all labs ordered are listed, but only abnormal results are displayed) Labs Reviewed  CBC WITH DIFFERENTIAL/PLATELET - Abnormal; Notable for the following components:      Result Value   MCV 102.1 (*)    MCH 36.0 (*)    All other components within normal limits  BASIC METABOLIC PANEL - Abnormal; Notable for the following components:   Potassium 3.0 (*)    CO2 21 (*)    Glucose, Bld 138 (*)    BUN <5 (*)    All other components within normal limits  CSF CELL COUNT WITH DIFFERENTIAL - Abnormal; Notable for the following components:   RBC Count, CSF 1 (*)    All other components within  normal limits  CSF CULTURE W GRAM STAIN  VITAMIN B12  FOLATE  ETHANOL  MAGNESIUM  CSF CELL COUNT WITH DIFFERENTIAL  GLUCOSE, CSF  PROTEIN, CSF  IGG CSF INDEX  DRAW EXTRA CLOT TUBE  OLIGOCLONAL BANDS, CSF + SERM  DRAW EXTRA CLOT TUBE  COMPREHENSIVE METABOLIC PANEL    EKG None  Radiology CT Head Wo Contrast  Result Date: 09/04/2021 CLINICAL DATA:  Numbness in both legs since flu shot November 2022, neurological deficit question stroke EXAM: CT HEAD WITHOUT CONTRAST TECHNIQUE: Contiguous axial images were obtained from the base of the skull through the vertex without intravenous contrast. RADIATION DOSE REDUCTION: This exam was performed according to the departmental dose-optimization program which includes automated exposure control, adjustment of the mA and/or kV according to patient size and/or use of iterative reconstruction technique. COMPARISON:  None FINDINGS: Brain: Normal ventricular morphology. No midline shift or mass effect. Normal appearance of brain parenchyma. No intracranial hemorrhage, mass lesion, evidence of acute infarction, or extra-axial fluid collection. Vascular: No hyperdense vessels Skull: Intact Sinuses/Orbits: Clear Other: N/A IMPRESSION: Normal exam. Electronically Signed   By: Lavonia Dana M.D.   On: 09/04/2021 14:18   MR Brain W and Wo Contrast  Result Date: 09/04/2021 CLINICAL DATA:  Initial evaluation for neuro deficit, stroke suspected. EXAM: MRI HEAD WITHOUT AND WITH CONTRAST TECHNIQUE: Multiplanar, multiecho pulse sequences of the brain and surrounding structures were obtained without and with intravenous contrast. CONTRAST:  40m GADAVIST GADOBUTROL 1 MMOL/ML IV SOLN COMPARISON:  Prior head CT from earlier the same day. FINDINGS: Brain: Cerebral volume within normal limits. No focal parenchymal signal abnormality or significant cerebral white matter disease. No abnormal foci of restricted diffusion to suggest acute or subacute ischemia. Gray-white matter  differentiation maintained. No encephalomalacia to suggest chronic cortical infarction. No acute or chronic intracranial blood products. No mass lesion, midline shift or mass effect no hydrocephalus or extra-axial fluid collection. Pituitary gland suprasellar region normal. Midline structures intact and normally formed. No abnormal enhancement. Vascular: Major intracranial vascular flow voids are maintained. Skull and upper cervical spine: Minimal cerebellar tonsillar ectopia of without frank Chiari malformation noted. Craniocervical junction otherwise unremarkable. Bone marrow signal intensity normal. No scalp soft tissue abnormality. Sinuses/Orbits: Globes orbital soft tissues within normal limits. Paranasal sinuses are largely clear. No significant mastoid effusion. Inner ear structures grossly normal. Other: None. IMPRESSION: Normal brain MRI.  No acute intracranial abnormality. Electronically Signed   By: BJeannine BogaM.D.   On: 09/04/2021 21:13   MR Cervical Spine W or Wo Contrast  Result Date: 09/04/2021 CLINICAL DATA:  Initial evaluation for acute leg weakness and paresthesias. EXAM: MRI CERVICAL SPINE WITHOUT  AND WITH CONTRAST TECHNIQUE: Multiplanar and multiecho pulse sequences of the cervical spine, to include the craniocervical junction and cervicothoracic junction, were obtained without and with intravenous contrast. CONTRAST:  93m GADAVIST GADOBUTROL 1 MMOL/ML IV SOLN COMPARISON:  None available. FINDINGS: Alignment: Straightening of the normal cervical lordosis. No listhesis. Vertebrae: Vertebral body height maintained without acute or chronic fracture. Bone marrow signal intensity within normal limits. No discrete or worrisome osseous lesions. No abnormal marrow edema or enhancement. Cord: Normal signal and morphology.  No abnormal enhancement. Posterior Fossa, vertebral arteries, paraspinal tissues: Unremarkable. Disc levels: No significant disc pathology seen within the cervical spine.  No disc bulge or focal disc herniation. No significant facet disease. No canal or neural foraminal stenosis or evidence for neural impingement. IMPRESSION: Normal MRI of the cervical spine and spinal cord. No findings to explain patient's symptoms. Electronically Signed   By: BJeannine BogaM.D.   On: 09/04/2021 21:17   MR THORACIC SPINE W WO CONTRAST  Result Date: 09/04/2021 CLINICAL DATA:  Initial evaluation for acute leg weakness with paresthesias. EXAM: MRI THORACIC WITHOUT AND WITH CONTRAST TECHNIQUE: Multiplanar and multiecho pulse sequences of the thoracic spine were obtained without and with intravenous contrast. CONTRAST:  196mGADAVIST GADOBUTROL 1 MMOL/ML IV SOLN COMPARISON:  None available. FINDINGS: Alignment: Physiologic with preservation of the normal thoracic kyphosis. No listhesis. Vertebrae: Vertebral body height maintained without acute or chronic fracture. Bone marrow signal intensity within normal limits. Subcentimeter benign hemangioma noted within the T9 vertebral body. No other discrete or worrisome osseous lesions. No abnormal marrow edema or enhancement. Cord:  Normal signal and morphology.  No abnormal enhancement. Paraspinal and other soft tissues: Unremarkable. Disc levels: T7-8: Small central disc protrusion indents the ventral thecal sac (series 24, image 10). Mild flattening of the ventral cord without cord signal changes or significant spinal stenosis. Foramina remain patent. Otherwise, no other significant disc pathology seen elsewhere within the thoracic spine. No other disc bulge or focal disc herniation. Small endplate Schmorl's node deformity noted at the inferior endplate of T1F16Minor lower lumbar facet hypertrophy. No spinal stenosis. Foramina remain patent. IMPRESSION: 1. Small central disc protrusion at T7-8 with mild flattening of the ventral spinal cord, but no cord signal changes or significant spinal stenosis. 2. Otherwise unremarkable and normal MRI of the  thoracic spine and spinal cord. No other findings to explain patient's symptoms identified. Electronically Signed   By: BeJeannine Boga.D.   On: 09/04/2021 21:27    Procedures .Lumbar Puncture  Date/Time: 09/04/2021 11:48 PM Performed by: WeDaleen BoMD Authorized by: WeDaleen BoMD   Consent:    Consent obtained:  Verbal and written   Risks discussed:  Bleeding, infection and pain Universal protocol:    Procedure explained and questions answered to patient or proxy's satisfaction: yes     Immediately prior to procedure a time out was called: yes     Site/side marked: yes     Patient identity confirmed:  Verbally with patient Pre-procedure details:    Procedure purpose:  Diagnostic   Preparation: Patient was prepped and draped in usual sterile fashion   Anesthesia:    Anesthesia method:  Local infiltration   Local anesthetic:  Lidocaine 1% w/o epi Procedure details:    Lumbar space:  L3-L4 interspace   Patient position:  Sitting   Needle gauge:  22   Needle type:  Diamond point   Needle length (in):  3.5   Ultrasound guidance: no  Number of attempts:  1   Fluid appearance:  Clear   Tubes of fluid:  4   Total volume (ml):  6 Post-procedure details:    Puncture site:  Adhesive bandage applied   Procedure completion:  Tolerated well, no immediate complications    Medications Ordered in ED Medications  0.9 %  sodium chloride infusion (0 mLs Intravenous Stopped 09/04/21 1716)  potassium chloride 10 mEq in 100 mL IVPB (0 mEq Intravenous Stopped 09/04/21 1716)  LORazepam (ATIVAN) injection 0.5 mg (0.5 mg Intravenous Given 09/04/21 1812)  gadobutrol (GADAVIST) 1 MMOL/ML injection 10 mL (10 mLs Intravenous Contrast Given 09/04/21 1912)  potassium chloride SA (KLOR-CON M) CR tablet 40 mEq (40 mEq Oral Given 09/04/21 2122)    ED Course/ Medical Decision Making/ A&P Clinical Course as of 09/04/21 2346  Thu Sep 04, 2021  2226 I reached out to the current neuro hospitalist,  Dr. Theda Sers, regarding the status of this case.  Some labs are pending, because they are send out.  We are waiting on IgG CSF index and oligoclonal bands in the CSF and serum, to return.  These are expected to return in 2 days.  Dr. Theda Sers has reviewed the MRIs of the cervical and thoracic spine.  He feels that the patient can be managed as an outpatient to follow-up on labs and follow-up with neurology. [EW]  2323 Patient is comfortable now stating he feels better.  Patient's partner is in the room and explained the findings to them.  She is worried about the patient "carry a gun at work."  He feels like he can do that.  They understand that 2 labs are pending and will need to be followed up on.  He will go to his PCP, to get those results and further care as needed.  He understands that he has high blood pressure that needs to be followed up by his PCP soon. [EW]    Clinical Course User Index [EW] Daleen Bo, MD                           Medical Decision Making Patient presenting for ongoing weakness and paresthesia, with a sensory level in the mid thorax, and tingling in the hands.  He has persistent symptoms.  He has not been comprehensively evaluated for this.  He was seen by his PCP who referred him to neurology but he has not been able to secure appointment until next month.  Because of this he came here for further evaluation on the more urgent basis.  He was hospitalized about 2 weeks ago with severe hypokalemia and his symptoms were attributed to that.  Despite being treated for hypokalemia, his symptoms have persisted.  Problems Addressed: Hypokalemia: acute illness or injury    Details: Recurrent hypokalemia despite taking medication Numbness: chronic illness or injury    Details: Onset greater than 3 months ago associated with general weakness Weakness: chronic illness or injury    Details: Onset several weeks ago associated with paresthesia  Amount and/or Complexity of Data  Reviewed Independent Historian:     Details: He is a cogent historian External Data Reviewed: labs and notes.    Details: Recent hospitalization with hypokalemia and hypomagnesemia both treated successfully. Labs: ordered.    Details: Normal except MCV high, potassium low, CO2 low, glucose high Radiology: ordered and independent interpretation performed.    Details: CT head, MRI brain, MRI cervical spine MRI thoracic.  No visible  lesions.  Discussed these results with the neuro hospitalist who also viewed the films. Discussion of management or test interpretation with external provider(s): Case discussed with neuro hospitalist (Dr. Malen Gauze) to plan evaluation with MRIs and LP.  Care again discussed with neuro hospitalist (Dr. Theda Sers) at the time of disposition, lacking CSF studies for IgG and oligoclonal bands.  The neurologist feels the patient is stable for discharge without further interventions at this time.  He recommended neurology follow-up.  Risk Prescription drug management. Decision regarding hospitalization. Risk Details: Patient being evaluated in the ED for numbness and weakness, with clinical concern for Guillain-Barr, which would be very stable with 21-monthduration.  Examination that could be performed and testing, completed.  No acute problems discovered.  He has hypertension and tachycardia, and possibly dysautonomia.  Doubt hypertensive urgency or acute metabolic instability.  He is currently taking potassium.  He has PCP for follow-up to discuss laboratory results, and blood pressure monitoring.  He is encouraged to follow-up with her as soon as possible.  I will give him additional amatory referral to neurology to see if that can help get him in quicker.  Findings were discussed with the patient and his wife at discharge.  He does not require hospitalization at this time.  Critical Care Total time providing critical care: 80 minutes          Final Clinical  Impression(s) / ED Diagnoses Final diagnoses:  Numbness  Weakness  Hypokalemia  Secondary hypertension    Rx / DC Orders ED Discharge Orders          Ordered    Ambulatory referral to Neurology       Comments: An appointment is requested in approximately: 1 week   09/04/21 2335              WDaleen Bo MD 09/04/21 [phone]

## 2021-09-04 NOTE — ED Triage Notes (Signed)
Reports ongoing issues with numbness, and joint pain, progressive over the past months. Has a neuro referral, but is unable to get an appt for a while.  ?

## 2021-09-04 NOTE — ED Notes (Signed)
PT at MRI family member at bedside ? ?

## 2021-09-04 NOTE — ED Notes (Signed)
Patient transported to MRI 

## 2021-09-04 NOTE — ED Provider Triage Note (Signed)
Emergency Medicine Provider Triage Evaluation Note ? ?Henry Diaz , a 31 y.o. male  was evaluated in triage.  Pt complains of progressive numbness, joint pain starting in the feet, and spreading up to the level of his nipples at this point.  Patient reports some tingling in his hands.  Patient reports that he does not have tingling in the rest of his extremities just has a decreased sensation from the chest down.  He reports that as "like 50% of the touch it should be feeling".  Patient denies any problems with weakness.  Patient is concerned because of family history of brain cancer, as well as family history of MS.  His primary care doctor was concerned about Sharlyn Bologna? because he had a flu shot in November shortly before his symptoms started.  Patient denies any difficulty breathing.  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 ? ?Review of Systems  ?Positive: Numbness, tingling ?Negative: Shob, chest pain, headache ? ?Physical Exam  ?BP (!) 149/113 (BP Location: Left Arm)   Pulse (!) 122   Temp 98 ?F (36.7 ?C) (Oral)   Resp 18   Ht '6\' 1"'$  (1.854 m)   Wt 93.4 kg   SpO2 94%   BMI 27.17 kg/m?  ?Gen:   Awake, no distress   ?Resp:  Normal effort  ?MSK:   Moves extremities without difficulty  ?Other:  Intact strength throughout, no facial droop, AOx3 ? ?Medical Decision Making  ?Medically screening exam initiated at 12:49 PM.  Appropriate orders placed.  Henry Diaz was informed that the remainder of the evaluation will be completed by another provider, this initial triage assessment does not replace that evaluation, and the importance of remaining in the ED until their evaluation is complete. ? ?Workup initiated ?  ?Henry Pickler, PA-C ?09/04/21 1251 ? ?

## 2021-09-04 NOTE — Discharge Instructions (Signed)
At this point there are no findings for stroke or Guillain-Barr?.  There are 2 laboratory test pending which should return in about 48 hours.  Please discuss those results with your PCP.  Also see your PCP for blood pressure check and further care treatment as needed as soon as possible.  We are giving you another referral to neurology to hopefully get you in sooner.  You can always return here if needed for problems. ?

## 2021-09-04 NOTE — Progress Notes (Signed)
ON CALL PHONE CONSULT ? ?Call from Dr Eulis Foster @ [facility] ? ? ?Discussion-multiple months of numbness ascending from toes to mid chest along with difficulty with erection. Recommend LP with CSF studies--Glucose, Protein, Cell count, IgG index, Oligoclonal bands ?Imaging - MR brain,. cervical and thoracic spine with and without. ?If abnormal, call for formal consult. Otherwise outpatient neurology follow up. ? ?-- ?Amie Portland, MD ?Neurologist ?[facility] ?Pager: [phone] ? ?

## 2021-09-04 NOTE — ED Notes (Signed)
Spoke to Fleming in the lab who states they can add on the mag to existing specimen in lab.  ?

## 2021-09-08 LAB — IGG CSF INDEX
Albumin CSF-mCnc: 27 mg/dL (ref 10–45)
Albumin: 4 g/dL (ref 4.0–5.0)
CSF IgG Index: 0.6 (ref 0.0–0.7)
IgG (Immunoglobin G), Serum: 641 mg/dL (ref 603–1613)
IgG, CSF: 2.5 mg/dL (ref 0.0–10.3)
IgG/Alb Ratio, CSF: 0.09 (ref 0.00–0.25)

## 2021-09-08 LAB — CSF CULTURE W GRAM STAIN
Culture: NO GROWTH
Gram Stain: NONE SEEN

## 2021-09-09 LAB — OLIGOCLONAL BANDS, CSF + SERM

## 2021-09-11 ENCOUNTER — Ambulatory Visit: Payer: BC Managed Care – PPO | Admitting: Neurology

## 2021-09-16 ENCOUNTER — Encounter: Payer: Self-pay | Admitting: Neurology

## 2021-09-16 ENCOUNTER — Ambulatory Visit: Payer: BC Managed Care – PPO | Admitting: Neurology

## 2021-09-16 VITALS — BP 126/92 | HR 134 | Ht 73.0 in | Wt 203.5 lb

## 2021-09-16 DIAGNOSIS — R202 Paresthesia of skin: Secondary | ICD-10-CM | POA: Diagnosis not present

## 2021-09-16 NOTE — Progress Notes (Signed)
? ?Chief Complaint  ?Patient presents with  ? New Patient (Initial Visit)  ?  Rm 15. Accompanied by sister. ?NP/paper/[facility]/paresthesia, r/o guillain barre. ?Interested in screening for Tick borne diseases. ?C/o Numbness, twitching of extremities, fatigue, minimal appetite, joint pain, lack of strength causing depression and anxiety, warm hands, sweating feet, sleep difficutlies, and low libedo. Symptoms began around Oct/Nov 2022. Stopped eating dinner early Nov. Low potassium levels, hospitalized for it a few weeks ago.  ?Discuss MRI/spinal tap results.  ? ? ? ? ?ASSESSMENT AND PLAN ? ?Henry Diaz is a 31 y.o. male   ?Ascending paresthesia ?Generalized weakness ? Normal sensory examination, no detectable weakness, areflexia, ? This happened in the setting of prolonged GI symptoms, frequent nausea vomiting, hypokalemia, hypocalcemia, abnormal liver functional test, ? MRI of brain, cervical, thoracic spine showed no significant abnormalities. ? His symptoms are likely due to deconditioning, electrolyte imbalance, nutritional deficiency, history of alcohol abuse, ? Laboratory evaluations including copper level, inflammatory markers, thyroid function ? ? ?DIAGNOSTIC DATA (LABS, IMAGING, TESTING) ?- I reviewed patient records, labs, notes, testing and imaging myself where available. ?Laboratory evaluations in March 2023: CMP, creatinine 0.93, potassium 4.6, calcium 10.4, mild elevated AST 73, ALT 55, J18 841, folic acid 20, GGT 660, hepatitis B and C were negative, ? ?Spinal fluid testing March July 2023, normal total protein 43, glucose 62, cell count WBC 1, RBC 1, negative oligoclonal bands, IgG index, negative alcohol, folic acid, Y30 160, HIV, [place] [facility] initial presentation, CBC showed normal hemoglobin of 13.8, CMP showed potassium 2.1, calcium 8.8, decreased total protein of 5.4, AST of 87, ALT of 47, total albumin of 1.5, ? ? ?MEDICAL HISTORY: ? ?Henry Diaz, is a 31 year old  male, accompanied by his sister, seen in request by his primary care nurse practitioner   Therese Sarah D, for evaluation of numbness, initial evaluation was on September 16, 2021 ? ?I reviewed and summarized the referring note. PMhx ?GERD ?He used to drink  ? ?He works as a Engineer, manufacturing systems, he had a hospital admission in February presented with weakness, nausea vomiting, dysphagia started since September 2022, gradually getting worse, on admission, potassium was 2.1, required IV potassium supplement, GI consultation for dysphagia, EGD showed grade C esophagitis, esophageal ulcer along with gastritis and duodenitis, ? ?He was discharged on Protonix and sucralfate, was able to gradually tolerate food better ? ?However presented to emergency room on September 04, 2021 for complaints of progressive numbness and weakness, at emergency evaluation, he was noted to have significant low potassium 3.0, he reported rising numbness starting in February, initially involving toes, over few weeks time, ascending to nipple line, also involving upper extremity, including genital area numbness, but he denies bowel bladder incontinence ? ?He denies significant gait abnormality, but complains of generalized fatigue, weakness, ? ?I personally reviewed extensive evaluation over the past few months, including spinal fluid testing showed no significant abnormality ? ?MRI of brain with without contrast on September 04, 2021 that was normal ?MRI of cervical spine with without contrast and thoracic spine showed mild degenerative changes, no evidence of the central canal or foraminal narrowing ? ? ? ? ?PHYSICAL EXAM: ?  ?Vitals:  ? 09/16/21 0748  ?BP: (!) 126/92  ?Pulse: (!) 134  ?Weight: 203 lb 8 oz (92.3 kg)  ?Height: '6\' 1"'$  (1.854 m)  ? ?Not recorded ?  ? ? ?Body mass index is 26.85 kg/m?. ? ?PHYSICAL EXAMNIATION: ? ?Gen: NAD, conversant, well nourised, well groomed                     ?  Cardiovascular: Regular rate rhythm, no peripheral edema, warm,  nontender. ?Eyes: Conjunctivae clear without exudates or hemorrhage ?Neck: Supple, no carotid bruits. ?Pulmonary: Clear to auscultation bilaterally  ? ?NEUROLOGICAL EXAM: ? ?MENTAL STATUS: Anxious looking middle-age male ?Speech: ?   Speech is normal; fluent and spontaneous with normal comprehension.  ?Cognition: ?    Orientation to time, place and person ?    Normal recent and remote memory ?    Normal Attention span and concentration ?    Normal Language, naming, repeating,spontaneous speech ?    Fund of knowledge ?  ?CRANIAL NERVES: ?CN II: Visual fields are full to confrontation. Pupils are round equal and briskly reactive to light. ?CN III, IV, VI: extraocular movement are normal. No ptosis. ?CN V: Facial sensation is intact to light touch ?CN VII: Face is symmetric with normal eye closure  ?CN VIII: Hearing is normal to causal conversation. ?CN IX, X: Phonation is normal. ?CN XI: Head turning and shoulder shrug are intact ? ?MOTOR: ?There is no pronator drift of out-stretched arms. Muscle bulk and tone are normal. Muscle strength is normal. ? ?REFLEXES: ?Reflexes are  absent ? ?SENSORY: ?Intact to light touch, pinprick and vibratory sensation are intact in fingers and toes. ? ?COORDINATION: ?There is no trunk or limb dysmetria noted. ? ?GAIT/STANCE: ?He can get up from seated position arm crossed, steady, negative Romberg sign ? ?REVIEW OF SYSTEMS:  ?Full 14 system review of systems performed and notable only for as above ?All other review of systems were negative. ? ? ?ALLERGIES: ?Allergies  ?Allergen Reactions  ? Ceclor [Cefaclor]   ? ? ?HOME MEDICATIONS: ?Current Outpatient Medications  ?Medication Sig Dispense Refill  ? albuterol (VENTOLIN HFA) 108 (90 Base) MCG/ACT inhaler     ? folic acid (FOLVITE) 1 MG tablet Take 1 tablet (1 mg total) by mouth daily. 30 tablet 0  ? LORazepam (ATIVAN) 0.5 MG tablet TID PRN x 1days, BID PRN x 2 days, then daily PRN for anxiety/insomnia x 3days 10 tablet 0  ? Multiple  Vitamin (MULTIVITAMIN WITH MINERALS) TABS tablet Take 1 tablet by mouth daily.    ? ondansetron (ZOFRAN-ODT) 4 MG disintegrating tablet Take 4 mg by mouth every 4 (four) hours as needed for nausea/vomiting.    ? pantoprazole (PROTONIX) 40 MG tablet BID for 1 month then once a day onwards 60 tablet 1  ? potassium chloride SA (KLOR-CON M) 20 MEQ tablet Take 1 tablet (20 mEq total) by mouth daily. 15 tablet 0  ? sucralfate (CARAFATE) 1 GM/10ML suspension Take 10 mLs (1 g total) by mouth 4 (four) times daily -  with meals and at bedtime. 420 mL 1  ? thiamine 100 MG tablet Take 1 tablet (100 mg total) by mouth daily. 30 tablet 0  ? ?No current facility-administered medications for this visit.  ? ? ?PAST MEDICAL HISTORY: ?Past Medical History:  ?Diagnosis Date  ? GERD (gastroesophageal reflux disease)   ? ? ?PAST SURGICAL HISTORY: ?Past Surgical History:  ?Procedure Laterality Date  ? BIOPSY  08/23/2021  ? Procedure: BIOPSY;  Surgeon: Otis Brace, MD;  Location: [facility];  Service: Gastroenterology;;  ? ESOPHAGOGASTRODUODENOSCOPY N/A 08/23/2021  ? Procedure: ESOPHAGOGASTRODUODENOSCOPY (EGD);  Surgeon: Otis Brace, MD;  Location: [facility];  Service: Gastroenterology;  Laterality: N/A;  ? ? ?FAMILY HISTORY: ?History reviewed. No pertinent family history. ? ?SOCIAL HISTORY: ?Social History  ? ?Socioeconomic History  ? Marital status: Single  ?  Spouse name: Not on file  ? Number of children:  Not on file  ? Years of education: Not on file  ? Highest education level: Not on file  ?Occupational History  ? Not on file  ?Tobacco Use  ? Smoking status: Never  ?  Passive exposure: Never  ? Smokeless tobacco: Never  ?Vaping Use  ? Vaping Use: Never used  ?Substance and Sexual Activity  ? Alcohol use: Yes  ? Drug use: Yes  ?  Types: Marijuana  ? Sexual activity: Not on file  ?Other Topics Concern  ? Not on file  ?Social History Narrative  ? Not on file  ? ?Social Determinants of Health  ? ?Financial Resource Strain:  Not on file  ?Food Insecurity: Not on file  ?Transportation Needs: Not on file  ?Physical Activity: Not on file  ?Stress: Not on file  ?Social Connections: Not on file  ?Intimate Partner Violence: Not on f

## 2021-09-23 LAB — MULTIPLE MYELOMA PANEL, SERUM
Albumin SerPl Elph-Mcnc: 3.6 g/dL (ref 2.9–4.4)
Albumin/Glob SerPl: 1.3 (ref 0.7–1.7)
Alpha 1: 0.3 g/dL (ref 0.0–0.4)
Alpha2 Glob SerPl Elph-Mcnc: 0.8 g/dL (ref 0.4–1.0)
B-Globulin SerPl Elph-Mcnc: 1 g/dL (ref 0.7–1.3)
Gamma Glob SerPl Elph-Mcnc: 0.7 g/dL (ref 0.4–1.8)
Globulin, Total: 2.8 g/dL (ref 2.2–3.9)
IgA/Immunoglobulin A, Serum: 137 mg/dL (ref 90–386)
IgG (Immunoglobin G), Serum: 827 mg/dL (ref 603–1613)
IgM (Immunoglobulin M), Srm: 19 mg/dL — ABNORMAL LOW (ref 20–172)
Total Protein: 6.4 g/dL (ref 6.0–8.5)

## 2021-09-23 LAB — CK: Total CK: 28 U/L — ABNORMAL LOW (ref 49–439)

## 2021-09-23 LAB — ANCA PROFILE
Anti-MPO Antibodies: <0.2 units (ref 0.0–0.9)
Anti-PR3 Antibodies: <0.2 units (ref 0.0–0.9)
Atypical pANCA: <1:20 {titer}
C-ANCA: <1:20 {titer}
P-ANCA: <1:20 {titer}

## 2021-09-23 LAB — ACETYLCHOLINE RECEPTOR, BINDING: AChR Binding Ab, Serum: <0.03 nmol/L (ref 0.00–0.24)

## 2021-09-23 LAB — C-REACTIVE PROTEIN: CRP: 2 mg/L (ref 0–10)

## 2021-09-23 LAB — ANA W/REFLEX IF POSITIVE: Anti Nuclear Antibody (ANA): NEGATIVE

## 2021-09-23 LAB — TSH: TSH: 2.08 u[IU]/mL (ref 0.450–4.500)

## 2021-09-23 LAB — COPPER, SERUM: Copper: 126 ug/dL (ref 69–132)

## 2021-09-23 LAB — SEDIMENTATION RATE: Sed Rate: 5 mm/hr (ref 0–15)

## 2021-09-23 LAB — FERRITIN: Ferritin: 233 ng/mL (ref 30–400)

## 2021-09-23 LAB — LYME DISEASE SEROLOGY W/REFLEX: Lyme Total Antibody EIA: NEGATIVE

## 2021-10-29 ENCOUNTER — Ambulatory Visit (INDEPENDENT_AMBULATORY_CARE_PROVIDER_SITE_OTHER): Payer: BC Managed Care – PPO

## 2021-10-29 ENCOUNTER — Ambulatory Visit: Payer: BC Managed Care – PPO | Admitting: Podiatry

## 2021-10-29 ENCOUNTER — Encounter: Payer: Self-pay | Admitting: Podiatry

## 2021-10-29 DIAGNOSIS — G629 Polyneuropathy, unspecified: Secondary | ICD-10-CM | POA: Diagnosis not present

## 2021-10-29 DIAGNOSIS — M76822 Posterior tibial tendinitis, left leg: Secondary | ICD-10-CM | POA: Diagnosis not present

## 2021-10-29 DIAGNOSIS — M7672 Peroneal tendinitis, left leg: Secondary | ICD-10-CM | POA: Diagnosis not present

## 2021-10-30 NOTE — Progress Notes (Signed)
Subjective:  ? ?Patient ID: Henry Diaz, male   DOB: 31 y.o.   MRN: [id number]  ? ?HPI ?Patient states he has had a lot of health issues and has lost 55 pounds and has had diminished discomfort in the inside of his left ankle since then and has some swelling in the outside that he was concerned about.  Has numerous questions concerning these issues ? ? ?ROS ? ? ?   ?Objective:  ?Physical Exam  ?Neurovascular status intact with inflammation pain of a mild nature around the peroneal tendon left but also mild swelling around the posterior tibial around the navicular left.  Nothing of significance currently and patient did have extreme.  Of reduced activity secondary to stomach issues ? ?   ?Assessment:  ?Edema which may be due to changes in gait with him starting to get active again.  Patient is doing better I do not think needs surgery currently but I am satisfied  ? ?   ?Plan:  ?Reviewed condition and at this point I do not recommend anything aggressive but ultimately may still require surgery for the inside of the left heel and arch as he gets more active again.  Patient will be seen back as needed ?   ? ? ?

## 2021-11-26 ENCOUNTER — Ambulatory Visit: Payer: BC Managed Care – PPO | Admitting: Adult Health

## 2022-02-18 ENCOUNTER — Other Ambulatory Visit (HOSPITAL_COMMUNITY): Payer: Self-pay

## 2022-02-23 ENCOUNTER — Encounter (HOSPITAL_BASED_OUTPATIENT_CLINIC_OR_DEPARTMENT_OTHER): Payer: Self-pay | Admitting: *Deleted

## 2022-02-23 ENCOUNTER — Other Ambulatory Visit: Payer: Self-pay

## 2022-02-23 ENCOUNTER — Encounter (HOSPITAL_COMMUNITY): Payer: Self-pay

## 2022-02-23 ENCOUNTER — Observation Stay (HOSPITAL_BASED_OUTPATIENT_CLINIC_OR_DEPARTMENT_OTHER)
Admission: EM | Admit: 2022-02-23 | Discharge: 2022-02-24 | Disposition: A | Discharge status: Home / Self Care | Payer: BC Managed Care – PPO | Attending: Internal Medicine | Admitting: Internal Medicine

## 2022-02-23 DIAGNOSIS — R000 Tachycardia, unspecified: Secondary | ICD-10-CM | POA: Diagnosis not present

## 2022-02-23 DIAGNOSIS — E876 Hypokalemia: Principal | ICD-10-CM | POA: Insufficient documentation

## 2022-02-23 DIAGNOSIS — R7401 Elevation of levels of liver transaminase levels: Secondary | ICD-10-CM | POA: Insufficient documentation

## 2022-02-23 DIAGNOSIS — R202 Paresthesia of skin: Secondary | ICD-10-CM

## 2022-02-23 DIAGNOSIS — R112 Nausea with vomiting, unspecified: Secondary | ICD-10-CM | POA: Diagnosis present

## 2022-02-23 DIAGNOSIS — Z789 Other specified health status: Secondary | ICD-10-CM

## 2022-02-23 LAB — COMPREHENSIVE METABOLIC PANEL
ALT: 59 U/L — ABNORMAL HIGH (ref 0–44)
ALT: 59 U/L — ABNORMAL HIGH (ref 0–44)
AST: 97 U/L — ABNORMAL HIGH (ref 15–41)
AST: 125 U/L — ABNORMAL HIGH (ref 15–41)
Albumin: 3.6 g/dL (ref 3.5–5.0)
Albumin: 3 g/dL — ABNORMAL LOW (ref 3.5–5.0)
Alkaline Phosphatase: 89 U/L (ref 38–126)
Alkaline Phosphatase: 79 U/L (ref 38–126)
Anion gap: 15 (ref 5–15)
Anion gap: 12 (ref 5–15)
BUN: 4 mg/dL — ABNORMAL LOW (ref 6–20)
BUN: <5 mg/dL — ABNORMAL LOW (ref 6–20)
CO2: 38 mmol/L — ABNORMAL HIGH (ref 22–32)
CO2: 31 mmol/L (ref 22–32)
Calcium: 8.5 mg/dL — ABNORMAL LOW (ref 8.9–10.3)
Calcium: 8.3 mg/dL — ABNORMAL LOW (ref 8.9–10.3)
Chloride: 85 mmol/L — ABNORMAL LOW (ref 98–111)
Chloride: 97 mmol/L — ABNORMAL LOW (ref 98–111)
Creatinine, Ser: 0.88 mg/dL (ref 0.61–1.24)
Creatinine, Ser: 0.91 mg/dL (ref 0.61–1.24)
GFR, Estimated: >60 mL/min (ref >60)
GFR, Estimated: >60 mL/min (ref >60)
Glucose, Bld: 122 mg/dL — ABNORMAL HIGH (ref 70–99)
Glucose, Bld: 107 mg/dL — ABNORMAL HIGH (ref 70–99)
Potassium: 2.1 mmol/L — CL (ref 3.5–5.1)
Potassium: 2.6 mmol/L — CL (ref 3.5–5.1)
Sodium: 138 mmol/L (ref 135–145)
Sodium: 140 mmol/L (ref 135–145)
Total Bilirubin: 1.3 mg/dL — ABNORMAL HIGH (ref 0.3–1.2)
Total Bilirubin: 1.9 mg/dL — ABNORMAL HIGH (ref 0.3–1.2)
Total Protein: 5.9 g/dL — ABNORMAL LOW (ref 6.5–8.1)
Total Protein: 5.4 g/dL — ABNORMAL LOW (ref 6.5–8.1)

## 2022-02-23 LAB — CBC WITH DIFFERENTIAL/PLATELET
Abs Immature Granulocytes: 0.01 10*3/uL (ref 0.00–0.07)
Basophils Absolute: 0 10*3/uL (ref 0.0–0.1)
Basophils Relative: 1 %
Eosinophils Absolute: 0.1 10*3/uL (ref 0.0–0.5)
Eosinophils Relative: 1 %
HCT: 41.5 % (ref 39.0–52.0)
Hemoglobin: 15.8 g/dL (ref 13.0–17.0)
Immature Granulocytes: 0 %
Lymphocytes Relative: 43 %
Lymphs Abs: 2.1 10*3/uL (ref 0.7–4.0)
MCH: 34.7 pg — ABNORMAL HIGH (ref 26.0–34.0)
MCHC: 38.1 g/dL — ABNORMAL HIGH (ref 30.0–36.0)
MCV: 91.2 fL (ref 80.0–100.0)
Monocytes Absolute: 0.5 10*3/uL (ref 0.1–1.0)
Monocytes Relative: 11 %
Neutro Abs: 2.1 10*3/uL (ref 1.7–7.7)
Neutrophils Relative %: 44 %
Platelets: 348 10*3/uL (ref 150–400)
RBC: 4.55 MIL/uL (ref 4.22–5.81)
RDW: 15.5 % (ref 11.5–15.5)
WBC: 4.8 10*3/uL (ref 4.0–10.5)
nRBC: 0 % (ref 0.0–0.2)

## 2022-02-23 LAB — MAGNESIUM
Magnesium: 1.3 mg/dL — ABNORMAL LOW (ref 1.7–2.4)
Magnesium: 1.8 mg/dL (ref 1.7–2.4)

## 2022-02-23 LAB — LIPASE, BLOOD: Lipase: 28 U/L (ref 11–51)

## 2022-02-23 LAB — PHOSPHORUS
Phosphorus: 3.4 mg/dL (ref 2.5–4.6)
Phosphorus: 2.6 mg/dL (ref 2.5–4.6)

## 2022-02-23 MED ORDER — ACETAMINOPHEN 325 MG PO TABS: 325.0000 mg | ORAL_TABLET | Freq: Four times a day (QID) | ORAL | Status: DC | PRN

## 2022-02-23 MED ORDER — MELATONIN 5 MG PO TABS: 5.0000 mg | ORAL_TABLET | Freq: Every evening | ORAL | Status: DC | PRN

## 2022-02-23 MED ORDER — THIAMINE MONONITRATE 100 MG PO TABS
100.0000 mg | ORAL_TABLET | Freq: Every day | ORAL | Status: DC
  Administered 2022-02-24: 100 mg via ORAL
  Filled 2022-02-23: qty 1

## 2022-02-23 MED ORDER — POTASSIUM CHLORIDE CRYS ER 20 MEQ PO TBCR
20.0000 meq | EXTENDED_RELEASE_TABLET | Freq: Every day | ORAL | Status: DC
  Administered 2022-02-24: 20 meq via ORAL
  Filled 2022-02-23: qty 1

## 2022-02-23 MED ORDER — ADULT MULTIVITAMIN W/MINERALS CH
1.0000 | ORAL_TABLET | Freq: Every day | ORAL | Status: DC
  Administered 2022-02-24: 1 via ORAL
  Filled 2022-02-23: qty 1

## 2022-02-23 MED ORDER — PANTOPRAZOLE SODIUM 40 MG PO TBEC: 40.0000 mg | DELAYED_RELEASE_TABLET | Freq: Every day | ORAL | 0 refills | Status: AC

## 2022-02-23 MED ORDER — POTASSIUM CHLORIDE IN NACL 20-0.9 MEQ/L-% IV SOLN
INTRAVENOUS | Status: DC
  Filled 2022-02-23 (×2): qty 1000

## 2022-02-23 MED ORDER — POTASSIUM CHLORIDE IN NACL 20-0.9 MEQ/L-% IV SOLN
INTRAVENOUS | Status: DC
  Filled 2022-02-23 (×3): qty 1000

## 2022-02-23 MED ORDER — TRAZODONE HCL 50 MG PO TABS: 50.0000 mg | ORAL_TABLET | Freq: Every day | ORAL | Status: DC

## 2022-02-23 MED ORDER — POLYETHYLENE GLYCOL 3350 17 G PO PACK: 17.0000 g | PACK | Freq: Every day | ORAL | Status: DC | PRN

## 2022-02-23 MED ORDER — PROCHLORPERAZINE EDISYLATE 10 MG/2ML IJ SOLN: 10.0000 mg | Freq: Four times a day (QID) | INTRAMUSCULAR | Status: DC | PRN

## 2022-02-23 MED ORDER — LACTATED RINGERS IV BOLUS
1000.0000 mL | Freq: Once | INTRAVENOUS | Status: AC
  Administered 2022-02-23: 1000 mL via INTRAVENOUS

## 2022-02-23 MED ORDER — MAGNESIUM SULFATE 2 GM/50ML IV SOLN
2.0000 g | Freq: Once | INTRAVENOUS | Status: AC
  Administered 2022-02-23: 2 g via INTRAVENOUS
  Filled 2022-02-23: qty 50

## 2022-02-23 MED ORDER — LORAZEPAM 2 MG/ML IJ SOLN
1.0000 mg | Freq: Once | INTRAMUSCULAR | Status: AC
  Administered 2022-02-23: 1 mg via INTRAVENOUS
  Filled 2022-02-23: qty 1

## 2022-02-23 MED ORDER — POTASSIUM CHLORIDE 10 MEQ/100ML IV SOLN
10.0000 meq | INTRAVENOUS | Status: AC
  Administered 2022-02-23 – 2022-02-24 (×4): 10 meq via INTRAVENOUS
  Filled 2022-02-23 (×4): qty 100

## 2022-02-23 MED ORDER — HYDROXYZINE HCL 25 MG PO TABS: 25.0000 mg | ORAL_TABLET | Freq: Every evening | ORAL | 0 refills | Status: DC | PRN

## 2022-02-23 MED ORDER — HYDROXYZINE HCL 25 MG PO TABS
25.0000 mg | ORAL_TABLET | Freq: Every evening | ORAL | Status: DC
  Administered 2022-02-23: 25 mg via ORAL

## 2022-02-23 MED ORDER — PANTOPRAZOLE SODIUM 40 MG IV SOLR
40.0000 mg | Freq: Once | INTRAVENOUS | Status: AC
  Administered 2022-02-23: 40 mg via INTRAVENOUS
  Filled 2022-02-23: qty 10

## 2022-02-23 MED ORDER — LACTATED RINGERS IV SOLN: INTRAVENOUS | Status: DC

## 2022-02-23 MED ORDER — HYDROXYZINE HCL 25 MG PO TABS
25.0000 mg | ORAL_TABLET | Freq: Three times a day (TID) | ORAL | Status: DC | PRN
  Administered 2022-02-24: 25 mg via ORAL
  Filled 2022-02-23 (×2): qty 1

## 2022-02-23 MED ORDER — PANTOPRAZOLE SODIUM 40 MG PO TBEC: 40.0000 mg | DELAYED_RELEASE_TABLET | Freq: Every day | ORAL | 0 refills | Status: DC

## 2022-02-23 MED ORDER — POTASSIUM CHLORIDE CRYS ER 20 MEQ PO TBCR
60.0000 meq | EXTENDED_RELEASE_TABLET | Freq: Once | ORAL | Status: AC
  Administered 2022-02-23: 60 meq via ORAL
  Filled 2022-02-23: qty 3

## 2022-02-23 MED ORDER — PANTOPRAZOLE SODIUM 40 MG PO TBEC
40.0000 mg | DELAYED_RELEASE_TABLET | Freq: Every day | ORAL | Status: DC
  Administered 2022-02-24: 40 mg via ORAL
  Filled 2022-02-23: qty 1

## 2022-02-23 MED ORDER — TRAZODONE HCL 50 MG PO TABS
50.0000 mg | ORAL_TABLET | ORAL | Status: AC
  Administered 2022-02-23: 50 mg via ORAL
  Filled 2022-02-23: qty 1

## 2022-02-23 MED ORDER — POTASSIUM CHLORIDE 10 MEQ/100ML IV SOLN
10.0000 meq | Freq: Once | INTRAVENOUS | Status: AC
  Administered 2022-02-23: 10 meq via INTRAVENOUS
  Filled 2022-02-23: qty 100

## 2022-02-23 NOTE — H&P (Signed)
History and Physical  ARAGON SCARANTINO [facility]:[id number] DOB: [birth year] DOA: 02/23/2022  Referring physician: Accepted by Dr. Olevia Bowens, [facility], hospitalist service, as a transfer from [facility] ED. PCP: Osa Craver, NP  Outpatient Specialists: GI. Patient coming from: Home.  Chief Complaint: Nausea and vomiting, lethargy.  HPI: Henry Diaz is a 31 y.o. male with medical history significant for hypokalemia on home potassium supplement, peripheral paresthesia, LA grade C esophagitis, esophageal ulcer with no bleeding, small hiatal hernia, gastritis, duodenitis, diagnosed on EGD done on 08/23/2021, anxiety/insomnia, who initially presented to [facility] ED due to nausea, vomiting, poor oral intake x10 days.  Associated with lethargy.  The patient attributes his symptoms to being out of his prescription for Protonix, was waiting for his prescription to be refilled by his PCP.  States his symptoms improved after restarting his home Protonix on Friday, 3 days ago.  Today he felt sluggish so decided to come to the ED for further evaluation.    In the ED, he was found to be severely hypokalemic with serum potassium of 2.1 and serum magnesium of 1.3.  Additionally was found to have elevated liver chemistries.  Denies abdominal pain.  Denies hematochezia or melena.  No subjective fevers or chills.  [facility], hospitalist service was asked to admit.  The patient was accepted by Dr. Olevia Bowens and transferred to [facility] for further care.  ED Course: Tmax 99.3.  BP 141/97, pulse 108, respiration rate 16, O2 saturation 100% on room air.  Lab studies remarkable for serum potassium 2.1, magnesium 1.3, serum bicarb 38, glucose 122, AST 97, ALT 59, T. bili 1.3.  CBC essentially unremarkable.  Review of Systems: Review of systems as noted in the HPI. All other systems reviewed and are negative.   Past Medical History:  Diagnosis Date   GERD (gastroesophageal reflux disease)    Past Surgical History:  Procedure  Laterality Date   BIOPSY  08/23/2021   Procedure: BIOPSY;  Surgeon: Otis Brace, MD;  Location: [facility];  Service: Gastroenterology;;   ESOPHAGOGASTRODUODENOSCOPY N/A 08/23/2021   Procedure: ESOPHAGOGASTRODUODENOSCOPY (EGD);  Surgeon: Otis Brace, MD;  Location: [facility];  Service: Gastroenterology;  Laterality: N/A;    Social History:  reports that he has never smoked. He has never been exposed to tobacco smoke. His smokeless tobacco use includes chew. He reports current alcohol use. He reports current drug use. Drug: Marijuana.   Allergies  Allergen Reactions   Cefaclor    Family history: Father with history of brain cancer.  Prior to Admission medications   Medication Sig Start Date End Date Taking? Authorizing Provider  pantoprazole (PROTONIX) 40 MG tablet BID for 1 month then once a day onwards Patient taking differently: Take 40 mg by mouth daily before breakfast. 08/24/21  Yes Aline August, MD  Multiple Vitamin (MULTIVITAMIN WITH MINERALS) TABS tablet Take 1 tablet by mouth daily.    [provider]  ondansetron (ZOFRAN-ODT) 4 MG disintegrating tablet Take 4 mg by mouth every 4 (four) hours as needed for nausea/vomiting. 08/13/21   [provider]  potassium chloride SA (KLOR-CON M) 20 MEQ tablet Take 1 tablet (20 mEq total) by mouth daily. 08/24/21   Aline August, MD  thiamine 100 MG tablet Take 1 tablet (100 mg total) by mouth daily. 08/24/21   Aline August, MD    Physical Exam: BP (!) 141/97 (BP Location: Right Arm)   Pulse (!) 108   Temp 99.3 F (37.4 C) (Oral)   Resp 16   Ht  $'6\' 1"'M$  (1.854 m)   Wt 87.1 kg   SpO2 100%   BMI 25.33 kg/m   General: 31 y.o. year-old male well developed well nourished in no acute distress.  Alert and oriented x3. Cardiovascular: Regular rate and rhythm with no rubs or gallops.  No thyromegaly or JVD noted.  No lower extremity edema. 2/4 pulses in all 4 extremities. Respiratory: Clear to auscultation  with no wheezes or rales. Good inspiratory effort. Abdomen: Soft nontender nondistended with normal bowel sounds x4 quadrants. Muskuloskeletal: No cyanosis, clubbing or edema noted bilaterally Neuro: CN II-XII intact, strength, sensation, reflexes Skin: No ulcerative lesions noted or rashes Psychiatry: Judgement and insight appear normal. Mood is appropriate for condition and setting          Labs on Admission:  Basic Metabolic Panel: Recent Labs  Lab 02/23/22 0731  NA 138  K 2.1*  CL 85*  CO2 38*  GLUCOSE 122*  BUN 4*  CREATININE 0.88  CALCIUM 8.5*  MG 1.3*  PHOS 3.4   Liver Function Tests: Recent Labs  Lab 02/23/22 0731  AST 97*  ALT 59*  ALKPHOS 89  BILITOT 1.3*  PROT 5.9*  ALBUMIN 3.6   Recent Labs  Lab 02/23/22 0731  LIPASE 28   No results for input(s): "AMMONIA" in the last 168 hours. CBC: Recent Labs  Lab 02/23/22 0731  WBC 4.8  NEUTROABS 2.1  HGB 15.8  HCT 41.5  MCV 91.2  PLT 348   Cardiac Enzymes: No results for input(s): "CKTOTAL", "CKMB", "CKMBINDEX", "TROPONINI" in the last 168 hours.  BNP (last 3 results) No results for input(s): "BNP" in the last 8760 hours.  ProBNP (last 3 results) No results for input(s): "PROBNP" in the last 8760 hours.  CBG: No results for input(s): "GLUCAP" in the last 168 hours.  Radiological Exams on Admission: No results found.  EKG: I independently viewed the EKG done and my findings are as followed: Sinus tachycardia rate of 110.  Nonspecific ST-T changes.  QTc 482.  Assessment/Plan Present on Admission:  Hypokalemia  Principal Problem:   Hypokalemia  Acute on chronic hypokalemia, likely exacerbated by GI losses On potassium supplement at baseline for chronic hypokalemia Presented with 10-day history of emesis Serum potassium on presentation 2.1 and magnesium of 1.3, these were repleted intravenously. Continue NS KCl 20 mEq started in the ED, reduce rate to 75 cc/h from 125 cc/h x 1 day.  Monitor  volume status.  Repeat labs.  Hypomagnesemia Presenting with serum magnesium 1.3 Repleted intravenously Repeat magnesium level  Elevated liver chemistries History of severe fatty liver seen on CT scan 08/21/2021 AST ALT elevated, T. bili 1.3 Repeat labs if liver chemistries are up-trending obtain abdominal ultrasound to rule out gallstones or biliary obstruction. Acute hepatitis panel  Intractable nausea and vomiting, unclear etiology Possibly related to history of esophagitis, gastritis and duodenitis. Improved and now tolerating a solid diet. Supportive care IV antiemetics Advance diet as tolerated Avoid dehydration Refill for Protonix sent to pharmacy per patient's request.  Sinus tachycardia Suspect secondary to dehydration Last TSH 2.080 on 09/16/2021. Continue IV fluid hydration Continue to monitor vital signs  Anxiety/insomnia Takes hydroxyzine as needed nightly Refill for hydroxyzine sent to pharmacy per patient's request.    DVT prophylaxis: SCDs.  The patient is ambulatory.  Code Status: Full code  Family Communication: None at bedside  Disposition Plan: Admitted to telemetry unit, accepted by Dr. Olevia Bowens, [facility].  Consults called: None.  Admission status: Observation status.   Status  is: Observation    Kayleen Memos MD [facility] Pager [phone]  If 7PM-7AM, please contact night-coverage [email or web address] Password [facility]  02/23/2022, 7:36 PM

## 2022-02-23 NOTE — Progress Notes (Signed)
Plan of Care Note for accepted transfer   Patient: Henry Diaz MRN: [id number]   DOA: 02/23/2022  Facility requesting transfer: [facility]. Requesting Provider: Vivi Martens, MD. Reason for transfer: Severe hypokalemia. Facility course:  Per Dr. Zenia Resides:  "Medard T Negash is a 31 y.o. male.   31 year old male presents with 10 days of emesis but gets worse when he tries to eat.  Patient has had chronic peripheral paresthesias and has been worked up for this in the past.  Is been associate with hypokalemia.  Patient states he has gastritis and has been seen by GI for this.  Notes has been very frustrated at not being given a definitive diagnosis.  All records reviewed extensively on this patient.  Patient states that things began worse after he was not able to take Protonix.  Denies any bloody stools.  No ataxia.  No severe headaches."  Lab work:  CBC with Differential/Platelet [[id number]] (Abnormal)   Collected: 02/23/22 0731   Updated: 02/23/22 0847   Specimen Type: Blood   Specimen Source: Vein    WBC 4.8 K/uL   RBC 4.55 MIL/uL   Hemoglobin 15.8 g/dL   HCT 41.5 %   MCV 91.2 fL   MCH 34.7 High  pg   MCHC 38.1 High  g/dL   RDW 15.5 %   Platelets 348 K/uL   nRBC 0.0 %   Neutrophils Relative % 44 %   Neutro Abs 2.1 K/uL   Lymphocytes Relative 43 %   Lymphs Abs 2.1 K/uL   Monocytes Relative 11 %   Monocytes Absolute 0.5 K/uL   Eosinophils Relative 1 %   Eosinophils Absolute 0.1 K/uL   Basophils Relative 1 %   Basophils Absolute 0.0 K/uL   Immature Granulocytes 0 %   Abs Immature Granulocytes 0.01 K/uL  Comprehensive metabolic panel [[id number]] (Abnormal)   Collected: 02/23/22 0731   Updated: 02/23/22 0818   Specimen Type: Blood   Specimen Source: Vein    Sodium 138 mmol/L   Potassium 2.1 Low Panic   mmol/L   Chloride 85 Low  mmol/L   CO2 38 High  mmol/L   Glucose, Bld 122 High  mg/dL   BUN 4 Low  mg/dL   Creatinine, Ser 0.88 mg/dL   Calcium 8.5 Low  mg/dL   Total Protein 5.9 Low   g/dL   Albumin 3.6 g/dL   AST 97 High  U/L   ALT 59 High  U/L   Alkaline Phosphatase 89 U/L   Total Bilirubin 1.3 High  mg/dL   GFR, Estimated >60 mL/min   Anion gap 15  Lipase, blood [[id number]]   Collected: 02/23/22 0731   Updated: 02/23/22 0818   Specimen Type: Blood   Specimen Source: Vein    Lipase 28 U/L  Magnesium [[id number]] (Abnormal)   Collected: 02/23/22 0731   Updated: 02/23/22 0818   Specimen Type: Blood   Specimen Source: Vein    Magnesium 1.3 Low  mg/dL   Plan of care: The patient received LR 1000 mL bolus, magnesium sulfate, oral and IV potassium supplementation.  He is accepted for admission to Telemetry unit, at [facility]..    Author: Reubin Milan, MD 02/23/2022  Check [email or web address] for on-call coverage.  Nursing staff, Please call [place] number on Amion as soon as patient's arrival, so appropriate admitting provider can evaluate the pt.

## 2022-02-23 NOTE — ED Notes (Signed)
Handoff report given to [facility] 

## 2022-02-23 NOTE — ED Triage Notes (Signed)
Pt c/o of fatigue and not eating well for the past 10 days. States hx of low potassium. C/o of numbness in extremities and feet. Denies any cp/sob.

## 2022-02-23 NOTE — ED Notes (Signed)
Handoff report given to [place] on 4W at [facility]

## 2022-02-23 NOTE — ED Provider Notes (Signed)
[facility] EMERGENCY DEPT Provider Note   CSN: [id number] Arrival date & time: 02/23/22  6222     History  No chief complaint on file.   Henry Diaz is a 31 y.o. male.  31 year old male presents with 10 days of emesis but gets worse when he tries to eat.  Patient has had chronic peripheral paresthesias and has been worked up for this in the past.  Is been associate with hypokalemia.  Patient states he has gastritis and has been seen by GI for this.  Notes has been very frustrated at not being given a definitive diagnosis.  All records reviewed extensively on this patient.  Patient states that things began worse after he was not able to take Protonix.  Denies any bloody stools.  No ataxia.  No severe headaches.       Home Medications Prior to Admission medications   Medication Sig Start Date End Date Taking? Authorizing Provider  albuterol (VENTOLIN HFA) 108 (90 Base) MCG/ACT inhaler  11/18/16   [provider]  folic acid (FOLVITE) 1 MG tablet Take 1 tablet (1 mg total) by mouth daily. 08/24/21   Aline August, MD  LORazepam (ATIVAN) 0.5 MG tablet TID PRN x 1days, BID PRN x 2 days, then daily PRN for anxiety/insomnia x 3days 08/24/21   Aline August, MD  Multiple Vitamin (MULTIVITAMIN WITH MINERALS) TABS tablet Take 1 tablet by mouth daily.    [provider]  ondansetron (ZOFRAN-ODT) 4 MG disintegrating tablet Take 4 mg by mouth every 4 (four) hours as needed for nausea/vomiting. 08/13/21   [provider]  pantoprazole (PROTONIX) 40 MG tablet BID for 1 month then once a day onwards 08/24/21   Aline August, MD  potassium chloride SA (KLOR-CON M) 20 MEQ tablet Take 1 tablet (20 mEq total) by mouth daily. 08/24/21   Aline August, MD  sucralfate (CARAFATE) 1 GM/10ML suspension Take 10 mLs (1 g total) by mouth 4 (four) times daily -  with meals and at bedtime. 08/24/21   Aline August, MD  thiamine 100 MG tablet Take 1 tablet (100 mg total) by  mouth daily. 08/24/21   Aline August, MD      Allergies    Ceclor [cefaclor]    Review of Systems   Review of Systems  All other systems reviewed and are negative.   Physical Exam Updated Vital Signs BP (!) 120/99 (BP Location: Right Arm)   Pulse (!) 125   Temp 99 F (37.2 C) (Oral)   Resp 18   Ht 1.854 m ('6\' 1"'$ )   Wt 87.1 kg   SpO2 99%   BMI 25.33 kg/m  Physical Exam Vitals and nursing note reviewed.  Constitutional:      General: He is not in acute distress.    Appearance: Normal appearance. He is well-developed. He is not toxic-appearing.  HENT:     Head: Normocephalic and atraumatic.  Eyes:     General: Lids are normal.     Conjunctiva/sclera: Conjunctivae normal.     Pupils: Pupils are equal, round, and reactive to light.  Neck:     Thyroid: No thyroid mass.     Trachea: No tracheal deviation.  Cardiovascular:     Rate and Rhythm: Normal rate and regular rhythm.     Heart sounds: Normal heart sounds. No murmur heard.    No gallop.  Pulmonary:     Effort: Pulmonary effort is normal. No respiratory distress.     Breath sounds: Normal breath  sounds. No stridor. No decreased breath sounds, wheezing, rhonchi or rales.  Abdominal:     General: There is no distension.     Palpations: Abdomen is soft.     Tenderness: There is no abdominal tenderness. There is no rebound.  Musculoskeletal:        General: No tenderness. Normal range of motion.     Cervical back: Normal range of motion and neck supple.  Skin:    General: Skin is warm and dry.     Findings: No abrasion or rash.  Neurological:     General: No focal deficit present.     Mental Status: He is alert and oriented to person, place, and time. Mental status is at baseline.     GCS: GCS eye subscore is 4. GCS verbal subscore is 5. GCS motor subscore is 6.     Cranial Nerves: No cranial nerve deficit.     Sensory: No sensory deficit.     Motor: Motor function is intact.  Psychiatric:        Attention and  Perception: Attention normal.        Speech: Speech normal.        Behavior: Behavior normal.     ED Results / Procedures / Treatments   Labs (all labs ordered are listed, but only abnormal results are displayed) Labs Reviewed  CBC WITH DIFFERENTIAL/PLATELET  COMPREHENSIVE METABOLIC PANEL  LIPASE, BLOOD  MAGNESIUM    EKG EKG Interpretation  Date/Time:  Monday February 23 2022 07:20:00 EDT Ventricular Rate:  110 PR Interval:  157 QRS Duration: 104 QT Interval:  356 QTC Calculation: 482 R Axis:   107 Text Interpretation: Sinus tachycardia Inferior infarct, old Consider anterior infarct No significant change since last tracing Confirmed by Lacretia Leigh ([id number]) on 02/23/2022 8:42:18 AM  Radiology No results found.  Procedures Procedures    Medications Ordered in ED Medications  lactated ringers bolus 1,000 mL (has no administration in time range)  lactated ringers infusion (has no administration in time range)  pantoprazole (PROTONIX) injection 40 mg (has no administration in time range)    ED Course/ Medical Decision Making/ A&P                           Medical Decision Making Amount and/or Complexity of Data Reviewed Labs: ordered. ECG/medicine tests: ordered.  Risk Prescription drug management.  Patient's EKG per my interpretation shows slight prolongation of his QTc.  Given IV Protonix. Presents with emesis and paresthesias.  Has evidence of severe hypokalemia with potassium of 2.1.  Also has hypomagnesemia as well.  Patient given oral as well as IV potassium as well as IV magnesium.  Do not feel that this is a central process and that he would benefit from intracranial imaging.  Plan will be for patient to be admitted to the hospital service.  Wife and patient informed of plan who is at bedside and is agreeable        Final Clinical Impression(s) / ED Diagnoses Final diagnoses:  None    Rx / DC Orders ED Discharge Orders     None          Lacretia Leigh, MD 02/23/22 [phone]

## 2022-02-24 DIAGNOSIS — E876 Hypokalemia: Secondary | ICD-10-CM | POA: Diagnosis not present

## 2022-02-24 LAB — COMPREHENSIVE METABOLIC PANEL
ALT: 57 U/L — ABNORMAL HIGH (ref 0–44)
AST: 149 U/L — ABNORMAL HIGH (ref 15–41)
Albumin: 2.5 g/dL — ABNORMAL LOW (ref 3.5–5.0)
Alkaline Phosphatase: 63 U/L (ref 38–126)
Anion gap: 10 (ref 5–15)
BUN: <5 mg/dL — ABNORMAL LOW (ref 6–20)
CO2: 29 mmol/L (ref 22–32)
Calcium: 8.3 mg/dL — ABNORMAL LOW (ref 8.9–10.3)
Chloride: 101 mmol/L (ref 98–111)
Creatinine, Ser: 0.85 mg/dL (ref 0.61–1.24)
GFR, Estimated: >60 mL/min (ref >60)
Glucose, Bld: 104 mg/dL — ABNORMAL HIGH (ref 70–99)
Potassium: 3 mmol/L — ABNORMAL LOW (ref 3.5–5.1)
Sodium: 140 mmol/L (ref 135–145)
Total Bilirubin: 1.7 mg/dL — ABNORMAL HIGH (ref 0.3–1.2)
Total Protein: 4.6 g/dL — ABNORMAL LOW (ref 6.5–8.1)

## 2022-02-24 LAB — CBC
HCT: 33.7 % — ABNORMAL LOW (ref 39.0–52.0)
Hemoglobin: 12.3 g/dL — ABNORMAL LOW (ref 13.0–17.0)
MCH: 35.2 pg — ABNORMAL HIGH (ref 26.0–34.0)
MCHC: 36.5 g/dL — ABNORMAL HIGH (ref 30.0–36.0)
MCV: 96.6 fL (ref 80.0–100.0)
Platelets: 224 10*3/uL (ref 150–400)
RBC: 3.49 MIL/uL — ABNORMAL LOW (ref 4.22–5.81)
RDW: 15.9 % — ABNORMAL HIGH (ref 11.5–15.5)
WBC: 4.6 10*3/uL (ref 4.0–10.5)
nRBC: 0 % (ref 0.0–0.2)

## 2022-02-24 LAB — HEPATITIS PANEL, ACUTE
HCV Ab: NONREACTIVE
Hep A IgM: NONREACTIVE
Hep B C IgM: NONREACTIVE
Hepatitis B Surface Ag: NONREACTIVE

## 2022-02-24 LAB — BASIC METABOLIC PANEL
Anion gap: 9 (ref 5–15)
BUN: 5 mg/dL — ABNORMAL LOW (ref 6–20)
CO2: 31 mmol/L (ref 22–32)
Calcium: 8.1 mg/dL — ABNORMAL LOW (ref 8.9–10.3)
Chloride: 99 mmol/L (ref 98–111)
Creatinine, Ser: 0.74 mg/dL (ref 0.61–1.24)
GFR, Estimated: >60 mL/min (ref >60)
Glucose, Bld: 111 mg/dL — ABNORMAL HIGH (ref 70–99)
Potassium: 3 mmol/L — ABNORMAL LOW (ref 3.5–5.1)
Sodium: 139 mmol/L (ref 135–145)

## 2022-02-24 MED ORDER — POTASSIUM CHLORIDE CRYS ER 20 MEQ PO TBCR
40.0000 meq | EXTENDED_RELEASE_TABLET | Freq: Every day | ORAL | 0 refills | Status: DC
Start: 2022-02-24 — End: 2022-03-05

## 2022-02-24 MED ORDER — POTASSIUM CHLORIDE CRYS ER 20 MEQ PO TBCR
40.0000 meq | EXTENDED_RELEASE_TABLET | Freq: Once | ORAL | Status: AC
  Administered 2022-02-24: 40 meq via ORAL
  Filled 2022-02-24: qty 2

## 2022-02-24 MED ORDER — POTASSIUM CHLORIDE CRYS ER 20 MEQ PO TBCR
40.0000 meq | EXTENDED_RELEASE_TABLET | Freq: Once | ORAL | Status: AC
Start: 2022-02-24 — End: 2022-02-24
  Administered 2022-02-24: 40 meq via ORAL
  Filled 2022-02-24: qty 2

## 2022-02-24 NOTE — Plan of Care (Signed)

## 2022-02-24 NOTE — Progress Notes (Signed)
   02/23/22 2335  Assess: MEWS Score  Temp 99.8 F (37.7 C)  BP (!) 141/101  MAP (mmHg) 113  Pulse Rate (!) 116  Resp 18  Level of Consciousness Alert  SpO2 100 %  O2 Device Room Air  Assess: MEWS Score  MEWS Temp 0  MEWS Systolic 0  MEWS Pulse 2  MEWS RR 0  MEWS LOC 0  MEWS Score 2  MEWS Score Color Yellow  Assess: if the MEWS score is Yellow or Red  Were vital signs taken at a resting state? Yes  Focused Assessment No change from prior assessment  Does the patient meet 2 or more of the SIRS criteria? Yes  Does the patient have a confirmed or suspected source of infection? No  MEWS guidelines implemented *See Row Information* Yes  Treat  MEWS Interventions Escalated (See documentation below)  Pain Scale 0-10  Pain Score 0  Take Vital Signs  Increase Vital Sign Frequency  Yellow: Q 2hr X 2 then Q 4hr X 2, if remains yellow, continue Q 4hrs  Escalate  MEWS: Escalate Yellow: discuss with charge nurse/RN and consider discussing with provider and RRT  Notify: Charge Nurse/RN  Name of Charge Nurse/RN Notified Lorri Frederick., RN  Date Charge Nurse/RN Notified 02/23/22  Time Charge Nurse/RN Notified 2355  Notify: Provider  Provider Name/Title E. Stark Klein, NP  Date Provider Notified 02/24/22  Time Provider Notified 0006  Method of Notification Page  Notification Reason Other (Comment) (patient's elevate HR and BP)  Provider response No new orders  Date of Provider Response 02/24/22  Time of Provider Response 0007  Document  Progress note created (see row info) Yes  Assess: SIRS CRITERIA  SIRS Temperature  0  SIRS Pulse 1  SIRS Respirations  0  SIRS WBC 1  SIRS Score Sum  2

## 2022-02-24 NOTE — TOC Initial Note (Signed)
Transition of Care [facility]) - Initial/Assessment Note    Patient Details  Name: Henry Diaz MRN: [id number] Date of Birth: 01-04-1991  Transition of Care [facility]) CM/SW Contact:    Leeroy Cha, RN Phone Number: 02/24/2022, 7:44 AM  Clinical Narrative:                  Transition of Care [facility]) Screening Note   Patient Details  Name: Henry Diaz Date of Birth: [birth year]   Transition of Care [facility]) CM/SW Contact:    Leeroy Cha, RN Phone Number: 02/24/2022, 7:44 AM    Transition of Care Department [facility]) has reviewed patient and no TOC needs have been identified at this time. We will continue to monitor patient advancement through interdisciplinary progression rounds. If new patient transition needs arise, please place a TOC consult.    Expected Discharge Plan: Home/Self Care Barriers to Discharge: Continued Medical Work up   Patient Goals and CMS Choice Patient states their goals for this hospitalization and ongoing recovery are:: to go back home and be well CMS Medicare.gov Compare Post Acute Care list provided to:: Patient    Expected Discharge Plan and Services Expected Discharge Plan: Home/Self Care       Living arrangements for the past 2 months: Single Family Home                                      Prior Living Arrangements/Services Living arrangements for the past 2 months: Single Family Home Lives with:: Spouse   Do you feel safe going back to the place where you live?: Yes               Activities of Daily Living Home Assistive Devices/Equipment: None ADL Screening (condition at time of admission) Patient's cognitive ability adequate to safely complete daily activities?: Yes Is the patient deaf or have difficulty hearing?: No Does the patient have difficulty seeing, even when wearing glasses/contacts?: No Does the patient have difficulty concentrating, remembering, or making decisions?: No Patient able to express need for  assistance with ADLs?: Yes Does the patient have difficulty dressing or bathing?: No Independently performs ADLs?: Yes (appropriate for developmental age) Does the patient have difficulty walking or climbing stairs?: No Weakness of Legs: Both Weakness of Arms/Hands: Both  Permission Sought/Granted                  Emotional Assessment Appearance:: Appears stated age Attitude/Demeanor/Rapport: Engaged   Orientation: : Oriented to Self, Oriented to Place, Oriented to  Time, Oriented to Situation Alcohol / Substance Use: Alcohol Use, Illicit Drugs Psych Involvement: No (comment)  Admission diagnosis:  Hypokalemia [E87.6] Patient Active Problem List   Diagnosis Date Noted   Paresthesia 09/16/2021   Hypomagnesemia 08/23/2021   Macrocytic anemia 08/23/2021   GERD with esophagitis 08/22/2021   Dysphagia 08/22/2021   Weight loss, non-intentional 08/22/2021   Dehydration 08/22/2021   Alcohol use 08/22/2021   Fatty liver 08/22/2021   Prolonged QT interval 08/22/2021   Hypokalemia 08/21/2021   PCP:  Osa Craver, NP Pharmacy:   [facility] [place], [place] [address] [place] [address] Phone: [phone] Fax: [phone]     Social Determinants of Health (SDOH) Interventions    Readmission Risk Interventions   No data to display

## 2022-02-24 NOTE — Discharge Summary (Signed)
Physician Discharge Summary  Henry Diaz:[id number] DOB: 11-01-1990 DOA: 02/23/2022  PCP: Therese Sarah D, NP  Admit date: 02/23/2022 Discharge date: 02/24/2022  Admitted From: Home Discharge disposition: Home  Recommendations at discharge:  Continue potassium supplements for next 3 days Stop alcohol Continue Protonix Follow up with GI Follow-up with neurology and psychiatry   Brief narrative: Henry Diaz is a 31 y.o. male with PMH significant for hiatal hernia, GERD, LA grade C esophagitis, esophageal ulcer with no bleeding, gastritis, duodenitis, diagnosed on EGD done on 08/23/2021, also with history of hypokalemia on potassium supplement, peripheral paresthesia,  anxiety/insomnia. 8/28, patient presented to [facility] ED with nausea, vomiting, poor oral intake for 10 days, history with lethargy.  He attributes his symptoms to being out of his prescription for Protonix, was waiting for his prescription to be refilled by his PCP.  His symptoms gradually started to improve after he was able to resume Protonix 3 days prior.  However he continued to feel sluggish and he came to the ED on 8/28. In the ED, patient was afebrile, heart rate in 120s, blood pressure in normal range, breathing on room air Labs were significantly abnormal with potassium severely low at 2.1, serum bicarb elevated to 38, magnesium low at 1.3, mildly elevated AST/ALT and total bili. Accepted for observation under [place] service.  Subjective: Patient was seen and examined this morning.  [place] Caucasian male.  Lying down in bed.  Not in distress.  Feels better after he was able to eat and hold food down.  Wife at bedside. Patient states that he ran out of Protonix and could not get refill from his PCP on time leading to precipitation of symptoms.  Assessment and plan: Acute on chronic hypokalemia, likely exacerbated by GI losses Hypomagnesemia Patient was on on potassium supplement at baseline for chronic  hypokalemia Presented with 10-day history of emesis Serum potassium on presentation 2.1 and magnesium of 1.3, these were repleted intravenously. Magnesium level subsequently improved to 1.8 yesterday. Labs this morning with potassium improved but still low at 3.  KCl 40 mg and given twice today.  I will also put him on KCl 40 mg and daily for next 3 days.  Severe fatty liver disease  Elevated liver chemistries History of severe fatty liver seen on CT scan 08/21/2021 AST ALT mildly elevated, T. bili 1.3 Acute hepatitis panel negative. He was planned for right upper quadrant ultrasound but he did not want to remain n.p.o.  He states that he is aware of fatty liver. Recent Labs  Lab 02/23/22 0731 02/23/22 2144 02/24/22 0428  AST 97* 125* 149*  ALT 59* 59* 57*  ALKPHOS 89 79 63  BILITOT 1.3* 1.9* 1.7*  PROT 5.9* 5.4* 4.6*  ALBUMIN 3.6 3.0* 2.5*  LIPASE 28  --   --   PLT 348  --  224   Intractable nausea and vomiting, unclear etiology Possibly related to history of esophagitis, gastritis and duodenitis.  He states that his symptoms started after he ran out of Protonix.  Feeling much better now once Protonix has been resumed. Improved and now tolerating a solid diet. Supportive care Adequately hydrated. Patient needs to follow-up with GI as an outpatient.   Sinus tachycardia Suspect secondary to dehydration Last TSH 2.080 on 09/16/2021. Continue to monitor vital signs   Anxiety/insomnia Takes hydroxyzine as needed nightly Ambulatory referral to behavioral health provided  Chronic alcohol abuse Continues to drink.  He said he is cutting down.  We had  a long conversation about his drinking habit, risk of withdrawal symptoms, severe fatty liver disease and risk of liver cirrhosis. Patient seems motivated to quit.  Restless legs Referral given to [facility] neurology  Wounds:  -    Discharge Exam:   Vitals:   02/23/22 2335 02/24/22 0209 02/24/22 0433 02/24/22 1303  BP: (!)  141/101 (!) 144/98 (!) 139/93 (!) 136/102  Pulse: (!) 116 98 (!) 106 (!) 107  Resp: '18 18 17 14  '$ Temp: 99.8 F (37.7 C) 98.8 F (37.1 C) 99.1 F (37.3 C) 99 F (37.2 C)  TempSrc: Oral Oral Oral Oral  SpO2: 100% 95% 97% 98%  Weight:      Height:        Body mass index is 25.33 kg/m.  General exam: Pleasant, young Caucasian male.  Not in distress Skin: No rashes, lesions or ulcers. HEENT: Atraumatic, normocephalic, no obvious bleeding Lungs: Clear to auscultation bilaterally CVS: Sinus tachycardia improving, bowel sound present GI/Abd soft, improving tenderness in epigastrium, bowel sound present CNS: Alert, awake, oriented x3 Psychiatry: Mood appropriate Extremities: No pedal edema, no calf tenderness  Follow ups:    Follow-up Information     Osa Craver, NP Follow up.   Specialty: Family Medicine Contact information: [place] [id number] [phone]                 Discharge Instructions:   Discharge Instructions     Ambulatory referral to Neurology   Complete by: As directed    An appointment is requested in approximately: 2 weeks. 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   Ambulatory referral to Psychiatry   Complete by: As directed    Anxiety, chronic alcoholism   Call MD for:  difficulty breathing, headache or visual disturbances   Complete by: As directed    Call MD for:  extreme fatigue   Complete by: As directed    Call MD for:  hives   Complete by: As directed    Call MD for:  persistant dizziness or light-headedness   Complete by: As directed    Call MD for:  persistant nausea and vomiting   Complete by: As directed    Call MD for:  severe uncontrolled pain   Complete by: As directed    Call MD for:  temperature >100.4   Complete by: As directed    Diet general   Complete by: As directed    Discharge instructions   Complete by: As directed    Recommendations at  discharge:   Continue potassium supplements for next 3 days  Stop alcohol  Continue Protonix  Follow up with GI  Follow-up with neurology and psychiatry  General discharge instructions: Follow with Primary MD Osa Craver, NP in 7 days  Please request your PCP  to go over your hospital tests, procedures, radiology results at the follow up. Please get your medicines reviewed and adjusted.  Your PCP may decide to repeat certain labs or tests as needed. Do not drive, operate heavy machinery, perform activities at heights, swimming or participation in water activities or provide baby sitting services if your were admitted for syncope or siezures until you have seen by Primary MD or a Neurologist and advised to do so again. [place] Controlled Substance Reporting System database was reviewed. Do not drive, operate heavy machinery, perform activities at heights, swim, participate in water activities or provide baby-sitting services while on medications for pain, sleep and  mood until your outpatient physician has reevaluated you and advised to do so again.  You are strongly recommended to comply with the dose, frequency and duration of prescribed medications. Activity: As tolerated with Full fall precautions use walker/cane & assistance as needed Avoid using any recreational substances like cigarette, tobacco, alcohol, or non-prescribed drug. If you experience worsening of your admission symptoms, develop shortness of breath, life threatening emergency, suicidal or homicidal thoughts you must seek medical attention immediately by calling 911 or calling your MD immediately  if symptoms less severe. You must read complete instructions/literature along with all the possible adverse reactions/side effects for all the medicines you take and that have been prescribed to you. Take any new medicine only after you have completely understood and accepted all the possible adverse reactions/side effects.   Wear Seat belts while driving. You were cared for by a hospitalist during your hospital stay. If you have any questions about your discharge medications or the care you received while you were in the hospital after you are discharged, you can call the unit and ask to speak with the hospitalist or the covering physician. Once you are discharged, your primary care physician will handle any further medical issues. Please note that NO REFILLS for any discharge medications will be authorized once you are discharged, as it is imperative that you return to your primary care physician (or establish a relationship with a primary care physician if you do not have one).   Increase activity slowly   Complete by: As directed        Discharge Medications:   Allergies as of 02/24/2022       Reactions   Cefaclor Hives, Other (See Comments)   Allergic to this- was a baby        Medication List     STOP taking these medications    Aleve 220 MG tablet Generic drug: naproxen sodium   multivitamin with minerals Tabs tablet   Potassium 99 MG Tabs       TAKE these medications    hydrOXYzine 25 MG tablet Commonly known as: ATARAX Take 1 tablet (25 mg total) by mouth at bedtime as needed for anxiety. What changed:  when to take this reasons to take this   multivitamin Tabs tablet Take 1 tablet by mouth daily with breakfast.   pantoprazole 40 MG tablet Commonly known as: Protonix BID for 1 month then once a day onwards What changed:  how much to take how to take this when to take this additional instructions   pantoprazole 40 MG tablet Commonly known as: Protonix Take 1 tablet (40 mg total) by mouth daily. What changed: You were already taking a medication with the same name, and this prescription was added. Make sure you understand how and when to take each.   pantoprazole 40 MG tablet Commonly known as: Protonix Take 1 tablet (40 mg total) by mouth daily. What changed: You  were already taking a medication with the same name, and this prescription was added. Make sure you understand how and when to take each.   potassium chloride SA 20 MEQ tablet Commonly known as: KLOR-CON M Take 2 tablets (40 mEq total) by mouth daily for 3 doses. What changed: how much to take   thiamine 100 MG tablet Commonly known as: VITAMIN B1 Take 1 tablet (100 mg total) by mouth daily.   Tylenol PM Extra Strength 25-500 MG Tabs tablet Generic drug: diphenhydramine-acetaminophen Take 2 tablets by mouth at bedtime as  needed (for sleep).         The results of significant diagnostics from this hospitalization (including imaging, microbiology, ancillary and laboratory) are listed below for reference.    Procedures and Diagnostic Studies:   No results found.   Labs:   Basic Metabolic Panel: Recent Labs  Lab 02/23/22 0731 02/23/22 2144 02/24/22 0428 02/24/22 1134  NA 138 140 140 139  K 2.1* 2.6* 3.0* 3.0*  CL 85* 97* 101 99  CO2 38* '31 29 31  '$ GLUCOSE 122* 107* 104* 111*  BUN 4* <5* <5* 5*  CREATININE 0.88 0.91 0.85 0.74  CALCIUM 8.5* 8.3* 8.3* 8.1*  MG 1.3* 1.8  --   --   PHOS 3.4 2.6  --   --    GFR Estimated Creatinine Clearance: 148.4 mL/min (by C-G formula based on SCr of 0.74 mg/dL). Liver Function Tests: Recent Labs  Lab 02/23/22 0731 02/23/22 2144 02/24/22 0428  AST 97* 125* 149*  ALT 59* 59* 57*  ALKPHOS 89 79 63  BILITOT 1.3* 1.9* 1.7*  PROT 5.9* 5.4* 4.6*  ALBUMIN 3.6 3.0* 2.5*   Recent Labs  Lab 02/23/22 0731  LIPASE 28   No results for input(s): "AMMONIA" in the last 168 hours. Coagulation profile No results for input(s): "INR", "PROTIME" in the last 168 hours.  CBC: Recent Labs  Lab 02/23/22 0731 02/24/22 0428  WBC 4.8 4.6  NEUTROABS 2.1  --   HGB 15.8 12.3*  HCT 41.5 33.7*  MCV 91.2 96.6  PLT 348 224   Cardiac Enzymes: No results for input(s): "CKTOTAL", "CKMB", "CKMBINDEX", "TROPONINI" in the last 168  hours. BNP: Invalid input(s): "POCBNP" CBG: No results for input(s): "GLUCAP" in the last 168 hours. D-Dimer No results for input(s): "DDIMER" in the last 72 hours. Hgb A1c No results for input(s): "HGBA1C" in the last 72 hours. Lipid Profile No results for input(s): "CHOL", "HDL", "LDLCALC", "TRIG", "CHOLHDL", "LDLDIRECT" in the last 72 hours. Thyroid function studies No results for input(s): "TSH", "T4TOTAL", "T3FREE", "THYROIDAB" in the last 72 hours.  Invalid input(s): "FREET3" Anemia work up No results for input(s): "VITAMINB12", "FOLATE", "FERRITIN", "TIBC", "IRON", "RETICCTPCT" in the last 72 hours. Microbiology No results found for this or any previous visit (from the past 240 hour(s)).  Time coordinating discharge: 35 minutes  Signed: Hillary Schwegler  [facility] 02/24/2022, 1:10 PM

## 2022-03-01 ENCOUNTER — Encounter (HOSPITAL_COMMUNITY): Payer: Self-pay

## 2022-03-01 ENCOUNTER — Other Ambulatory Visit: Payer: Self-pay

## 2022-03-01 ENCOUNTER — Inpatient Hospital Stay (HOSPITAL_COMMUNITY)
Admission: EM | Admit: 2022-03-01 | Discharge: 2022-03-05 | DRG: 897 | Disposition: A | Discharge status: Home / Self Care | Payer: BC Managed Care – PPO | Attending: Family Medicine | Admitting: Family Medicine

## 2022-03-01 DIAGNOSIS — D7589 Other specified diseases of blood and blood-forming organs: Secondary | ICD-10-CM | POA: Diagnosis present

## 2022-03-01 DIAGNOSIS — K7010 Alcoholic hepatitis without ascites: Secondary | ICD-10-CM | POA: Diagnosis present

## 2022-03-01 DIAGNOSIS — Z789 Other specified health status: Secondary | ICD-10-CM | POA: Diagnosis present

## 2022-03-01 DIAGNOSIS — E876 Hypokalemia: Secondary | ICD-10-CM | POA: Diagnosis present

## 2022-03-01 DIAGNOSIS — F419 Anxiety disorder, unspecified: Secondary | ICD-10-CM | POA: Diagnosis present

## 2022-03-01 DIAGNOSIS — F10939 Alcohol use, unspecified with withdrawal, unspecified: Secondary | ICD-10-CM | POA: Diagnosis not present

## 2022-03-01 DIAGNOSIS — F10239 Alcohol dependence with withdrawal, unspecified: Secondary | ICD-10-CM | POA: Diagnosis not present

## 2022-03-01 DIAGNOSIS — F1029 Alcohol dependence with unspecified alcohol-induced disorder: Principal | ICD-10-CM

## 2022-03-01 LAB — CBC WITH DIFFERENTIAL/PLATELET
Abs Immature Granulocytes: 0.01 10*3/uL (ref 0.00–0.07)
Basophils Absolute: 0 10*3/uL (ref 0.0–0.1)
Basophils Relative: 1 %
Eosinophils Absolute: 0 10*3/uL (ref 0.0–0.5)
Eosinophils Relative: 0 %
HCT: 45.5 % (ref 39.0–52.0)
Hemoglobin: 16.2 g/dL (ref 13.0–17.0)
Immature Granulocytes: 0 %
Lymphocytes Relative: 32 %
Lymphs Abs: 2 10*3/uL (ref 0.7–4.0)
MCH: 35.8 pg — ABNORMAL HIGH (ref 26.0–34.0)
MCHC: 35.6 g/dL (ref 30.0–36.0)
MCV: 100.4 fL — ABNORMAL HIGH (ref 80.0–100.0)
Monocytes Absolute: 0.6 10*3/uL (ref 0.1–1.0)
Monocytes Relative: 10 %
Neutro Abs: 3.5 10*3/uL (ref 1.7–7.7)
Neutrophils Relative %: 57 %
Platelets: 248 10*3/uL (ref 150–400)
RBC: 4.53 MIL/uL (ref 4.22–5.81)
RDW: 15.2 % (ref 11.5–15.5)
WBC: 6.2 10*3/uL (ref 4.0–10.5)
nRBC: 0 % (ref 0.0–0.2)

## 2022-03-01 LAB — MAGNESIUM: Magnesium: 1.6 mg/dL — ABNORMAL LOW (ref 1.7–2.4)

## 2022-03-01 LAB — COMPREHENSIVE METABOLIC PANEL
ALT: 94 U/L — ABNORMAL HIGH (ref 0–44)
AST: 158 U/L — ABNORMAL HIGH (ref 15–41)
Albumin: 3.7 g/dL (ref 3.5–5.0)
Alkaline Phosphatase: 98 U/L (ref 38–126)
Anion gap: 18 — ABNORMAL HIGH (ref 5–15)
BUN: <5 mg/dL — ABNORMAL LOW (ref 6–20)
CO2: 30 mmol/L (ref 22–32)
Calcium: 8.9 mg/dL (ref 8.9–10.3)
Chloride: 97 mmol/L — ABNORMAL LOW (ref 98–111)
Creatinine, Ser: 0.87 mg/dL (ref 0.61–1.24)
GFR, Estimated: >60 mL/min (ref >60)
Glucose, Bld: 109 mg/dL — ABNORMAL HIGH (ref 70–99)
Potassium: 3.2 mmol/L — ABNORMAL LOW (ref 3.5–5.1)
Sodium: 145 mmol/L (ref 135–145)
Total Bilirubin: 1.3 mg/dL — ABNORMAL HIGH (ref 0.3–1.2)
Total Protein: 7 g/dL (ref 6.5–8.1)

## 2022-03-01 LAB — RAPID URINE DRUG SCREEN, HOSP PERFORMED
Amphetamines: NOT DETECTED
Barbiturates: NOT DETECTED
Benzodiazepines: NOT DETECTED
Cocaine: NOT DETECTED
Opiates: NOT DETECTED
Tetrahydrocannabinol: NOT DETECTED

## 2022-03-01 LAB — ETHANOL: Alcohol, Ethyl (B): 78 mg/dL — ABNORMAL HIGH (ref <10)

## 2022-03-01 LAB — LIPASE, BLOOD: Lipase: 44 U/L (ref 11–51)

## 2022-03-01 MED ORDER — THIAMINE HCL 100 MG/ML IJ SOLN: 100.0000 mg | Freq: Every day | INTRAMUSCULAR | Status: DC

## 2022-03-01 MED ORDER — POTASSIUM CHLORIDE 2 MEQ/ML IV SOLN
INTRAVENOUS | Status: AC
  Filled 2022-03-01 (×5): qty 1000

## 2022-03-01 MED ORDER — PANTOPRAZOLE SODIUM 40 MG IV SOLR
40.0000 mg | Freq: Once | INTRAVENOUS | Status: AC
  Administered 2022-03-01: 40 mg via INTRAVENOUS
  Filled 2022-03-01: qty 10

## 2022-03-01 MED ORDER — MAGNESIUM SULFATE 2 GM/50ML IV SOLN
2.0000 g | Freq: Once | INTRAVENOUS | Status: AC
  Administered 2022-03-01: 2 g via INTRAVENOUS
  Filled 2022-03-01: qty 50

## 2022-03-01 MED ORDER — LORAZEPAM 2 MG/ML IJ SOLN
1.0000 mg | INTRAMUSCULAR | Status: DC | PRN
  Administered 2022-03-02: 1 mg via INTRAVENOUS
  Administered 2022-03-03: 2 mg via INTRAVENOUS
  Administered 2022-03-03: 1 mg via INTRAVENOUS
  Administered 2022-03-03 (×2): 2 mg via INTRAVENOUS
  Filled 2022-03-01 (×5): qty 1

## 2022-03-01 MED ORDER — LORAZEPAM 1 MG PO TABS
1.0000 mg | ORAL_TABLET | ORAL | Status: AC | PRN
  Administered 2022-03-03: 1 mg via ORAL
  Administered 2022-03-03 (×2): 2 mg via ORAL
  Administered 2022-03-04 (×2): 4 mg via ORAL
  Administered 2022-03-04: 2 mg via ORAL
  Filled 2022-03-01: qty 4
  Filled 2022-03-01 (×2): qty 2
  Filled 2022-03-01 (×2): qty 4
  Filled 2022-03-01 (×2): qty 2
  Filled 2022-03-01: qty 4

## 2022-03-01 MED ORDER — LACTATED RINGERS IV BOLUS
1000.0000 mL | Freq: Once | INTRAVENOUS | Status: AC
  Administered 2022-03-01: 1000 mL via INTRAVENOUS

## 2022-03-01 MED ORDER — HEPARIN SODIUM (PORCINE) 5000 UNIT/ML IJ SOLN
5000.0000 [IU] | Freq: Three times a day (TID) | INTRAMUSCULAR | Status: DC
  Administered 2022-03-02 – 2022-03-05 (×9): 5000 [IU] via SUBCUTANEOUS
  Filled 2022-03-01 (×9): qty 1

## 2022-03-01 MED ORDER — LORAZEPAM 2 MG/ML IJ SOLN
2.0000 mg | Freq: Once | INTRAMUSCULAR | Status: AC
  Administered 2022-03-01: 2 mg via INTRAVENOUS
  Filled 2022-03-01: qty 1

## 2022-03-01 MED ORDER — POTASSIUM CHLORIDE 10 MEQ/100ML IV SOLN
10.0000 meq | Freq: Once | INTRAVENOUS | Status: AC
  Administered 2022-03-01: 10 meq via INTRAVENOUS
  Filled 2022-03-01: qty 100

## 2022-03-01 MED ORDER — ADULT MULTIVITAMIN W/MINERALS CH
1.0000 | ORAL_TABLET | Freq: Every day | ORAL | Status: DC
  Administered 2022-03-02 – 2022-03-05 (×4): 1 via ORAL
  Filled 2022-03-01 (×4): qty 1

## 2022-03-01 MED ORDER — CHLORDIAZEPOXIDE HCL 25 MG PO CAPS: ORAL_CAPSULE | ORAL | 0 refills | Status: DC

## 2022-03-01 MED ORDER — LORAZEPAM 2 MG/ML IJ SOLN: 0.0000 mg | Freq: Two times a day (BID) | INTRAMUSCULAR | Status: DC

## 2022-03-01 MED ORDER — THIAMINE MONONITRATE 100 MG PO TABS
100.0000 mg | ORAL_TABLET | Freq: Every day | ORAL | Status: DC
  Administered 2022-03-02 – 2022-03-05 (×4): 100 mg via ORAL
  Filled 2022-03-01 (×4): qty 1

## 2022-03-01 MED ORDER — FOLIC ACID 1 MG PO TABS
1.0000 mg | ORAL_TABLET | Freq: Every day | ORAL | Status: DC
  Administered 2022-03-02 – 2022-03-05 (×4): 1 mg via ORAL
  Filled 2022-03-01 (×4): qty 1

## 2022-03-01 MED ORDER — LORAZEPAM 2 MG/ML IJ SOLN
0.0000 mg | Freq: Four times a day (QID) | INTRAMUSCULAR | Status: DC
  Administered 2022-03-01: 2 mg via INTRAVENOUS
  Filled 2022-03-01: qty 1

## 2022-03-01 NOTE — ED Provider Notes (Addendum)
[facility] DEPT Provider Note   CSN: [id number] Arrival date & time: 03/01/22  1958     History  Chief Complaint  Patient presents with   Vomiting    Henry Diaz is a 31 y.o. male.  31 year old male who presents for alcohol detox.  Patient seen.  Just recently admitted for hypokalemia and hypomagnesemia.  Was in hospital for several days.  Had about a day and half of sobriety but then started drinking alcohol again.  States he had 4 drinks today and last drink was about 3 hours ago.  States he has had trouble eating and drinking regular food but is able to keep down alcohol okay.  States he feels slightly tremulous but denies any seizure activity.  No SI or HI.  Called EMS and was transported here       Home Medications Prior to Admission medications   Medication Sig Start Date End Date Taking? Authorizing Provider  hydrOXYzine (ATARAX) 25 MG tablet Take 1 tablet (25 mg total) by mouth at bedtime as needed for anxiety. 02/23/22 02/18/23  Kayleen Memos, DO  multivitamin (ONE-A-DAY MEN'S) TABS tablet Take 1 tablet by mouth daily with breakfast.    [provider]  pantoprazole (PROTONIX) 40 MG tablet BID for 1 month then once a day onwards Patient taking differently: Take 40 mg by mouth daily before breakfast. 08/24/21   Aline August, MD  pantoprazole (PROTONIX) 40 MG tablet Take 1 tablet (40 mg total) by mouth daily. 02/23/22 02/23/23  Kayleen Memos, DO  pantoprazole (PROTONIX) 40 MG tablet Take 1 tablet (40 mg total) by mouth daily. 02/23/22 02/23/23  Kayleen Memos, DO  potassium chloride SA (KLOR-CON M) 20 MEQ tablet Take 2 tablets (40 mEq total) by mouth daily for 3 doses. 02/24/22 02/27/22  Terrilee Croak, MD  thiamine 100 MG tablet Take 1 tablet (100 mg total) by mouth daily. 08/24/21   Aline August, MD  TYLENOL PM EXTRA STRENGTH 500-25 MG TABS tablet Take 2 tablets by mouth at bedtime as needed (for sleep).    [provider]       Allergies    Cefaclor    Review of Systems   Review of Systems  All other systems reviewed and are negative.   Physical Exam Updated Vital Signs BP (!) 140/103   Pulse (!) 129   Temp 98.2 F (36.8 C) (Oral)   Resp 12   SpO2 99%  Physical Exam Vitals and nursing note reviewed.  Constitutional:      General: He is not in acute distress.    Appearance: Normal appearance. He is well-developed. He is not toxic-appearing.  HENT:     Head: Normocephalic and atraumatic.  Eyes:     General: Lids are normal.     Conjunctiva/sclera: Conjunctivae normal.     Pupils: Pupils are equal, round, and reactive to light.  Neck:     Thyroid: No thyroid mass.     Trachea: No tracheal deviation.  Cardiovascular:     Rate and Rhythm: Regular rhythm. Tachycardia present.     Heart sounds: Normal heart sounds. No murmur heard.    No gallop.  Pulmonary:     Effort: Pulmonary effort is normal. No respiratory distress.     Breath sounds: Normal breath sounds. No stridor. No decreased breath sounds, wheezing, rhonchi or rales.  Abdominal:     General: There is no distension.     Palpations: Abdomen is soft.  Tenderness: There is no abdominal tenderness. There is no rebound.  Musculoskeletal:        General: No tenderness. Normal range of motion.     Cervical back: Normal range of motion and neck supple.  Skin:    General: Skin is warm and dry.     Findings: No abrasion or rash.  Neurological:     Mental Status: He is alert and oriented to person, place, and time. Mental status is at baseline.     GCS: GCS eye subscore is 4. GCS verbal subscore is 5. GCS motor subscore is 6.     Cranial Nerves: No cranial nerve deficit.     Sensory: No sensory deficit.     Motor: Motor function is intact.  Psychiatric:        Attention and Perception: Attention normal.        Mood and Affect: Mood is anxious. Affect is labile.        Speech: Speech is delayed. Speech is not slurred.        Behavior:  Behavior normal.        Thought Content: Thought content does not include homicidal or suicidal plan.     ED Results / Procedures / Treatments   Labs (all labs ordered are listed, but only abnormal results are displayed) Labs Reviewed  CBC WITH DIFFERENTIAL/PLATELET  COMPREHENSIVE METABOLIC PANEL  RAPID URINE DRUG SCREEN, HOSP PERFORMED  ETHANOL  MAGNESIUM    EKG EKG Interpretation  Date/Time:  Sunday March 01 2022 20:08:11 EDT Ventricular Rate:  132 PR Interval:  121 QRS Duration: 98 QT Interval:  313 QTC Calculation: 464 R Axis:   -77 Text Interpretation: Sinus tachycardia Inferior infarct, old Consider anterior infarct Confirmed by Lacretia Leigh ([id number]) on 03/01/2022 8:14:25 PM  Radiology No results found.  Procedures Procedures    Medications Ordered in ED Medications  lactated ringers bolus 1,000 mL (has no administration in time range)  lactated ringers bolus 1,000 mL (has no administration in time range)  pantoprazole (PROTONIX) injection 40 mg (has no administration in time range)    ED Course/ Medical Decision Making/ A&P                           Medical Decision Making Amount and/or Complexity of Data Reviewed Labs: ordered.  Risk Prescription drug management.  Urine drug screen negative. Patient is EKG shows sinus tachycardia.  Patient is CIWA score was 5.  Does not appear to be going through acute alcohol withdrawal.  Is tachycardic here and suspect some dehydration as he has not been eating or drinking in last couple days however he is able to keep down alcohol.  Given several liters of lactated Ringer's and heart rate has come down.  Does have some hypomagnesemia and will be treated with IV magnesium as well as mild hypokalemia which we treated with IV potassium.  His alcohol level is 78.  Will be given 2 mg of Ativan here IV push.  Mild transaminitis is noted on his comprehensive metabolic panel which is likely from alcohol abuse.  He is not  hypoglycemic.  Long discussion with the patient and significant other at the bedside.  Patient is agreeable to be referred out for outpatient detox and alcohol treatment as he wishes to stop using alcohol.  We will give patient prescription for Librium as well as outpatient referrals.   CRITICAL CARE Performed by: Leota Jacobsen Total critical care time: 3  minutes Critical care time was exclusive of separately billable procedures and treating other patients. Critical care was necessary to treat or prevent imminent or life-threatening deterioration. Critical care was time spent personally by me on the following activities: development of treatment plan with patient and/or surrogate as well as nursing, discussions with consultants, evaluation of patient's response to treatment, examination of patient, obtaining history from patient or surrogate, ordering and performing treatments and interventions, ordering and review of laboratory studies, ordering and review of radiographic studies, pulse oximetry and re-evaluation of patient's condition.  10:59 PM Patient reassessed and is tachycardic despite receiving IV fluids and Ativan.  Appears to be going through withdrawal.  Alcohol level is low.  He will require admission.  Spouse at bedside is agreeable to this       Final Clinical Impression(s) / ED Diagnoses Final diagnoses:  None    Rx / DC Orders ED Discharge Orders     None         Lacretia Leigh, MD 03/01/22 2152    Lacretia Leigh, MD 03/01/22 2259

## 2022-03-01 NOTE — H&P (Incomplete)
History and Physical    ULMER DEGEN HWK:[id number] DOB: 12-02-1990 DOA: 03/01/2022  PCP: Osa Craver, NP  Patient coming from: Home.  Chief Complaint: Tremors nausea vomiting.  HPI: Henry Diaz is a 31 y.o. male with history of alcohol use, esophagitis/duodenitis seen in endoscopy in February 2023 recently admitted for severe electrolyte derangements presents to the ER after patient has been having tremors and nausea vomiting.  Patient was just discharged home about 5 days ago following which she started drinking alcohol again and he started decreasing the drinks about 24 hours ago when he started having tremors and starting vomiting.  No blood in the vomitus.  ED Course: In the patient was tachycardic.  Labs do show elevated LFTs which are comparable to the recent past.  Hypokalemia hypomagnesemia seen which are replaced.  Patient admitted for alcohol withdrawal and electrolyte derangements.  Review of Systems: As per HPI, rest all negative.   Past Medical History:  Diagnosis Date   GERD (gastroesophageal reflux disease)     Past Surgical History:  Procedure Laterality Date   BIOPSY  08/23/2021   Procedure: BIOPSY;  Surgeon: Otis Brace, MD;  Location: [facility];  Service: Gastroenterology;;   ESOPHAGOGASTRODUODENOSCOPY N/A 08/23/2021   Procedure: ESOPHAGOGASTRODUODENOSCOPY (EGD);  Surgeon: Otis Brace, MD;  Location: [facility];  Service: Gastroenterology;  Laterality: N/A;     reports that he has never smoked. He has never been exposed to tobacco smoke. His smokeless tobacco use includes chew. He reports that he does not currently use alcohol. He reports that he does not currently use drugs after having used the following drugs: Marijuana.  Allergies  Allergen Reactions   Cefaclor Hives and Other (See Comments)    Allergic to this- was a baby    Family History  Family history unknown: Yes    Prior to Admission medications   Medication Sig Start Date  End Date Taking? Authorizing Provider  chlordiazePOXIDE (LIBRIUM) 25 MG capsule '50mg'$  PO TID x 1D, then 25-'50mg'$  PO BID X 1D, then 25-'50mg'$  PO QD X 1D 03/01/22  Yes Lacretia Leigh, MD  hydrOXYzine (ATARAX) 25 MG tablet Take 1 tablet (25 mg total) by mouth at bedtime as needed for anxiety. 02/23/22 02/18/23  Kayleen Memos, DO  multivitamin (ONE-A-DAY MEN'S) TABS tablet Take 1 tablet by mouth daily with breakfast.    [provider]  pantoprazole (PROTONIX) 40 MG tablet BID for 1 month then once a day onwards Patient taking differently: Take 40 mg by mouth daily before breakfast. 08/24/21   Aline August, MD  pantoprazole (PROTONIX) 40 MG tablet Take 1 tablet (40 mg total) by mouth daily. 02/23/22 02/23/23  Kayleen Memos, DO  pantoprazole (PROTONIX) 40 MG tablet Take 1 tablet (40 mg total) by mouth daily. 02/23/22 02/23/23  Kayleen Memos, DO  potassium chloride SA (KLOR-CON M) 20 MEQ tablet Take 2 tablets (40 mEq total) by mouth daily for 3 doses. 02/24/22 02/27/22  Terrilee Croak, MD  thiamine 100 MG tablet Take 1 tablet (100 mg total) by mouth daily. 08/24/21   Aline August, MD  TYLENOL PM EXTRA STRENGTH 500-25 MG TABS tablet Take 2 tablets by mouth at bedtime as needed (for sleep).    [provider]    Physical Exam: Constitutional: Moderately built and nourished. Vitals:   03/01/22 2100 03/01/22 2130 03/01/22 2200 03/01/22 2245  BP: 121/89 (!) 141/102 (!) 141/103 (!) 145/106  Pulse: (!) 117 (!) 115 (!) 119 (!) 121  Resp: 13 13  13 17  Temp:      TempSrc:      SpO2: 95% 94% 95% 94%  Weight:      Height:       Eyes: Anicteric no pallor. ENMT: No discharge from the ears eyes nose and mouth. Neck: No mass felt.  No neck rigidity. Respiratory: No rhonchi or crepitations. Cardiovascular: S1-S2 heard. Abdomen: Soft nontender bowel sound present. Musculoskeletal: No edema.  N Skin: No rash. Neurologic: Patient is sleeping but easily arousable and oriented to his name and place.   Moving all extremities. Psychiatric: Beeping but easily arousable oriented.   Labs on Admission: I have personally reviewed following labs and imaging studies  CBC: Recent Labs  Lab 02/23/22 0731 02/24/22 0428 03/01/22 2017  WBC 4.8 4.6 6.2  NEUTROABS 2.1  --  3.5  HGB 15.8 12.3* 16.2  HCT 41.5 33.7* 45.5  MCV 91.2 96.6 100.4*  PLT 348 224 185   Basic Metabolic Panel: Recent Labs  Lab 02/23/22 0731 02/23/22 2144 02/24/22 0428 02/24/22 1134 03/01/22 2017  NA 138 140 140 139 145  K 2.1* 2.6* 3.0* 3.0* 3.2*  CL 85* 97* 101 99 97*  CO2 38* '31 29 31 30  '$ GLUCOSE 122* 107* 104* 111* 109*  BUN 4* <5* <5* 5* <5*  CREATININE 0.88 0.91 0.85 0.74 0.87  CALCIUM 8.5* 8.3* 8.3* 8.1* 8.9  MG 1.3* 1.8  --   --  1.6*  PHOS 3.4 2.6  --   --   --    GFR: Estimated Creatinine Clearance: 136.5 mL/min (by C-G formula based on SCr of 0.87 mg/dL). Liver Function Tests: Recent Labs  Lab 02/23/22 0731 02/23/22 2144 02/24/22 0428 03/01/22 2017  AST 97* 125* 149* 158*  ALT 59* 59* 57* 94*  ALKPHOS 89 79 63 98  BILITOT 1.3* 1.9* 1.7* 1.3*  PROT 5.9* 5.4* 4.6* 7.0  ALBUMIN 3.6 3.0* 2.5* 3.7   Recent Labs  Lab 02/23/22 0731 03/01/22 2017  LIPASE 28 44   No results for input(s): "AMMONIA" in the last 168 hours. Coagulation Profile: No results for input(s): "INR", "PROTIME" in the last 168 hours. Cardiac Enzymes: No results for input(s): "CKTOTAL", "CKMB", "CKMBINDEX", "TROPONINI" in the last 168 hours. BNP (last 3 results) No results for input(s): "PROBNP" in the last 8760 hours. HbA1C: No results for input(s): "HGBA1C" in the last 72 hours. CBG: No results for input(s): "GLUCAP" in the last 168 hours. Lipid Profile: No results for input(s): "CHOL", "HDL", "LDLCALC", "TRIG", "CHOLHDL", "LDLDIRECT" in the last 72 hours. Thyroid Function Tests: No results for input(s): "TSH", "T4TOTAL", "FREET4", "T3FREE", "THYROIDAB" in the last 72 hours. Anemia Panel: No results for  input(s): "VITAMINB12", "FOLATE", "FERRITIN", "TIBC", "IRON", "RETICCTPCT" in the last 72 hours. Urine analysis: No results found for: "COLORURINE", "APPEARANCEUR", "LABSPEC", "PHURINE", "GLUCOSEU", "HGBUR", "BILIRUBINUR", "KETONESUR", "PROTEINUR", "UROBILINOGEN", "NITRITE", "LEUKOCYTESUR" Sepsis Labs: '@LABRCNTIP'$ (procalcitonin:4,lacticidven:4) )No results found for this or any previous visit (from the past 240 hour(s)).   Radiological Exams on Admission: No results found.  EKG: Independently reviewed.  Sinus tachycardia.  Assessment/Plan Principal Problem:   Alcohol withdrawal ([place]) Active Problems:   Hypokalemia   Alcohol use    Alcohol withdrawal for which patient has been placed on CIWA protocol.  IV Ativan thiamine.  Social work consult. Hypokalemia hypomagnesemia likely from vomiting.  Replace recheck.  IV fluids. History of esophagitis and duodenitis on Protonix. Nausea vomiting -if persist may consider further imaging. Elevated LFTs likely from alcoholic hepatitis.  Closely monitor. Macrocytosis -likely from alcoholism.  May check anemia panel with next blood draw.   DVT prophylaxis: Heparin.  Check INR with next blood draw. Code Status: Full code. Family Communication: Patient's wife. Disposition Plan: To be admitted. Consults called: Social work. Admission status: Observation.   Rise Patience MD [facility] Pager [phone].  If 7PM-7AM, please contact night-coverage [email or web address] Password [email or web address]  03/01/2022, 11:20 PM

## 2022-03-01 NOTE — ED Triage Notes (Signed)
Arrives EMS from  home with vomiting x 3 days.   Pt sts he is withdrawing from alcohol. Last drink today. Had 3 vodka beverages.   Recently seen and admitted with hypokalemia.   Denies si/ hi.

## 2022-03-02 DIAGNOSIS — D7589 Other specified diseases of blood and blood-forming organs: Secondary | ICD-10-CM | POA: Diagnosis present

## 2022-03-02 DIAGNOSIS — F10239 Alcohol dependence with withdrawal, unspecified: Secondary | ICD-10-CM | POA: Diagnosis present

## 2022-03-02 DIAGNOSIS — E876 Hypokalemia: Secondary | ICD-10-CM | POA: Diagnosis present

## 2022-03-02 DIAGNOSIS — F10939 Alcohol use, unspecified with withdrawal, unspecified: Secondary | ICD-10-CM | POA: Diagnosis not present

## 2022-03-02 DIAGNOSIS — K7010 Alcoholic hepatitis without ascites: Secondary | ICD-10-CM | POA: Diagnosis present

## 2022-03-02 DIAGNOSIS — F419 Anxiety disorder, unspecified: Secondary | ICD-10-CM | POA: Diagnosis present

## 2022-03-02 LAB — CBC WITH DIFFERENTIAL/PLATELET
Abs Immature Granulocytes: 0.01 10*3/uL (ref 0.00–0.07)
Basophils Absolute: 0 10*3/uL (ref 0.0–0.1)
Basophils Relative: 1 %
Eosinophils Absolute: 0 10*3/uL (ref 0.0–0.5)
Eosinophils Relative: 1 %
HCT: 36.8 % — ABNORMAL LOW (ref 39.0–52.0)
Hemoglobin: 12.8 g/dL — ABNORMAL LOW (ref 13.0–17.0)
Immature Granulocytes: 0 %
Lymphocytes Relative: 38 %
Lymphs Abs: 1.8 10*3/uL (ref 0.7–4.0)
MCH: 35.5 pg — ABNORMAL HIGH (ref 26.0–34.0)
MCHC: 34.8 g/dL (ref 30.0–36.0)
MCV: 101.9 fL — ABNORMAL HIGH (ref 80.0–100.0)
Monocytes Absolute: 0.5 10*3/uL (ref 0.1–1.0)
Monocytes Relative: 10 %
Neutro Abs: 2.4 10*3/uL (ref 1.7–7.7)
Neutrophils Relative %: 50 %
Platelets: 152 10*3/uL (ref 150–400)
RBC: 3.61 MIL/uL — ABNORMAL LOW (ref 4.22–5.81)
RDW: 14.8 % (ref 11.5–15.5)
WBC: 4.7 10*3/uL (ref 4.0–10.5)
nRBC: 0 % (ref 0.0–0.2)

## 2022-03-02 LAB — COMPREHENSIVE METABOLIC PANEL
ALT: 62 U/L — ABNORMAL HIGH (ref 0–44)
AST: 101 U/L — ABNORMAL HIGH (ref 15–41)
Albumin: 2.5 g/dL — ABNORMAL LOW (ref 3.5–5.0)
Alkaline Phosphatase: 66 U/L (ref 38–126)
Anion gap: 9 (ref 5–15)
BUN: <5 mg/dL — ABNORMAL LOW (ref 6–20)
CO2: 31 mmol/L (ref 22–32)
Calcium: 8.1 mg/dL — ABNORMAL LOW (ref 8.9–10.3)
Chloride: 102 mmol/L (ref 98–111)
Creatinine, Ser: 0.75 mg/dL (ref 0.61–1.24)
GFR, Estimated: >60 mL/min (ref >60)
Glucose, Bld: 84 mg/dL (ref 70–99)
Potassium: 3.9 mmol/L (ref 3.5–5.1)
Sodium: 142 mmol/L (ref 135–145)
Total Bilirubin: 1.3 mg/dL — ABNORMAL HIGH (ref 0.3–1.2)
Total Protein: 4.8 g/dL — ABNORMAL LOW (ref 6.5–8.1)

## 2022-03-02 LAB — MAGNESIUM: Magnesium: 2 mg/dL (ref 1.7–2.4)

## 2022-03-02 MED ORDER — PANTOPRAZOLE SODIUM 40 MG IV SOLR
40.0000 mg | INTRAVENOUS | Status: DC
Start: 2022-03-02 — End: 2022-03-04
  Administered 2022-03-02 – 2022-03-03 (×2): 40 mg via INTRAVENOUS
  Filled 2022-03-02 (×2): qty 10

## 2022-03-02 MED ORDER — LIP MEDEX EX OINT
TOPICAL_OINTMENT | CUTANEOUS | Status: DC | PRN
Start: 2022-03-02 — End: 2022-03-05
  Administered 2022-03-02: 75 via TOPICAL
  Filled 2022-03-02: qty 7

## 2022-03-02 MED ORDER — ORAL CARE MOUTH RINSE
15.0000 mL | OROMUCOSAL | Status: DC | PRN
Start: 2022-03-02 — End: 2022-03-05

## 2022-03-02 NOTE — TOC Progression Note (Signed)
Transition of Care [facility]) - Progression Note    Patient Details  Name: Henry Diaz MRN: [id number] Date of Birth: 1990/10/13  Transition of Care [facility]) CM/SW Contact  Gentle Hoge, Juliann Pulse, RN Phone Number: 03/02/2022, 4:12 PM  Clinical Narrative: ETOH resources already on AVS. Continue to monitor for d/c plans.           Expected Discharge Plan and Services                                                 Social Determinants of Health (SDOH) Interventions    Readmission Risk Interventions     No data to display

## 2022-03-02 NOTE — Progress Notes (Signed)
PROGRESS NOTE    Henry Diaz  YIF:[id number] DOB: 1990/08/25 DOA: 03/01/2022 PCP: Therese Sarah D, NP   Brief Narrative:  HPI: Henry Diaz is a 31 y.o. male with history of alcohol use, esophagitis/duodenitis seen in endoscopy in February 2023 recently admitted for severe electrolyte derangements presents to the ER after patient has been having tremors and nausea vomiting.  Patient was just discharged home about 5 days ago following which she started drinking alcohol again and he started decreasing the drinks about 24 hours ago when he started having tremors and starting vomiting.  No blood in the vomitus.   ED Course: In the patient was tachycardic.  Labs do show elevated LFTs which are comparable to the recent past.  Hypokalemia hypomagnesemia seen which are replaced.  Patient admitted for alcohol withdrawal and electrolyte derangements.    Assessment & Plan:   Principal Problem:   Alcohol withdrawal ([place]) Active Problems:   Hypokalemia   Alcohol use  Acute alcohol withdrawal/alcohol dependence: Going through mild withdrawal symptoms.  We will continue as needed Ativan.  If worsening symptoms, may consider starting on Librium.  Continue multivitamin.  Hypokalemia: Resolved.  Alcoholic hepatitis: LFTs improving.  Hypomagnesemia: Resolved.  DVT prophylaxis: heparin injection 5,000 Units Start: 03/02/22 0600   Code Status: Full Code  Family Communication: Patient's wife present at bedside.  Plan of care discussed with patient in length and he/she verbalized understanding and agreed with it.  Status is: Observation The patient will require care spanning > 2 midnights and should be moved to inpatient because: Going through alcohol withdrawal, will likely need another night or maybe 2.   Estimated body mass index is 25.33 kg/m as calculated from the following:   Height as of this encounter: '6\' 1"'$  (1.854 m).   Weight as of this encounter: 87.1 kg.    Nutritional  Assessment: Body mass index is 25.33 kg/m.Marland Kitchen Seen by dietician.  I agree with the assessment and plan as outlined below: Nutrition Status:        . Skin Assessment: I have examined the patient's skin and I agree with the wound assessment as performed by the wound care RN as outlined below:    Consultants:  None  Procedures:  None  Antimicrobials:  Anti-infectives (From admission, onward)    None         Subjective: Patient seen and examined.  He is sleepy because he received Ativan not too long ago.  Wife at the bedside.  Patient states that he is feeling well.  No complaints.  He sounds very motivated and is interested in going to inpatient rehab as well.  I will consult TOC.  Objective: Vitals:   03/02/22 0401 03/02/22 0554 03/02/22 0752 03/02/22 1028  BP: (!) 146/108 (!) 147/105 (!) 142/109 (!) 147/114  Pulse: (!) 108 (!) 108 (!) 116 (!) 109  Resp: '16 15 16 20  '$ Temp: 98.5 F (36.9 C) 98.9 F (37.2 C) 98.5 F (36.9 C) 99.1 F (37.3 C)  TempSrc: Oral Oral Oral Oral  SpO2: 98% 95% 99% 96%  Weight:      Height:        Intake/Output Summary (Last 24 hours) at 03/02/2022 1117 Last data filed at 03/02/2022 1100 Gross per 24 hour  Intake 4593.87 ml  Output 600 ml  Net 3993.87 ml   Filed Weights   03/01/22 2015  Weight: 87.1 kg    Examination:  General exam: Appears calm and comfortable  Respiratory system: Clear to auscultation.  Respiratory effort normal. Cardiovascular system: S1 & S2 heard, RRR. No JVD, murmurs, rubs, gallops or clicks. No pedal edema. Gastrointestinal system: Abdomen is nondistended, soft and nontender. No organomegaly or masses felt. Normal bowel sounds heard. Central nervous system: Alert and oriented. No focal neurological deficits. Extremities: Symmetric 5 x 5 power.  Mild fine tremors. Skin: No rashes, lesions or ulcers Psychiatry: Judgement and insight appear normal. Mood & affect appropriate.    Data Reviewed: I have personally  reviewed following labs and imaging studies  CBC: Recent Labs  Lab 02/24/22 0428 03/01/22 2017 03/02/22 0355  WBC 4.6 6.2 4.7  NEUTROABS  --  3.5 2.4  HGB 12.3* 16.2 12.8*  HCT 33.7* 45.5 36.8*  MCV 96.6 100.4* 101.9*  PLT 224 248 093   Basic Metabolic Panel: Recent Labs  Lab 02/23/22 2144 02/24/22 0428 02/24/22 1134 03/01/22 2017 03/02/22 0355  NA 140 140 139 145 142  K 2.6* 3.0* 3.0* 3.2* 3.9  CL 97* 101 99 97* 102  CO2 '31 29 31 30 31  '$ GLUCOSE 107* 104* 111* 109* 84  BUN <5* <5* 5* <5* <5*  CREATININE 0.91 0.85 0.74 0.87 0.75  CALCIUM 8.3* 8.3* 8.1* 8.9 8.1*  MG 1.8  --   --  1.6* 2.0  PHOS 2.6  --   --   --   --    GFR: Estimated Creatinine Clearance: 148.4 mL/min (by C-G formula based on SCr of 0.75 mg/dL). Liver Function Tests: Recent Labs  Lab 02/23/22 2144 02/24/22 0428 03/01/22 2017 03/02/22 0355  AST 125* 149* 158* 101*  ALT 59* 57* 94* 62*  ALKPHOS 79 63 98 66  BILITOT 1.9* 1.7* 1.3* 1.3*  PROT 5.4* 4.6* 7.0 4.8*  ALBUMIN 3.0* 2.5* 3.7 2.5*   Recent Labs  Lab 03/01/22 2017  LIPASE 44   No results for input(s): "AMMONIA" in the last 168 hours. Coagulation Profile: No results for input(s): "INR", "PROTIME" in the last 168 hours. Cardiac Enzymes: No results for input(s): "CKTOTAL", "CKMB", "CKMBINDEX", "TROPONINI" in the last 168 hours. BNP (last 3 results) No results for input(s): "PROBNP" in the last 8760 hours. HbA1C: No results for input(s): "HGBA1C" in the last 72 hours. CBG: No results for input(s): "GLUCAP" in the last 168 hours. Lipid Profile: No results for input(s): "CHOL", "HDL", "LDLCALC", "TRIG", "CHOLHDL", "LDLDIRECT" in the last 72 hours. Thyroid Function Tests: No results for input(s): "TSH", "T4TOTAL", "FREET4", "T3FREE", "THYROIDAB" in the last 72 hours. Anemia Panel: No results for input(s): "VITAMINB12", "FOLATE", "FERRITIN", "TIBC", "IRON", "RETICCTPCT" in the last 72 hours. Sepsis Labs: No results for input(s):  "PROCALCITON", "LATICACIDVEN" in the last 168 hours.  No results found for this or any previous visit (from the past 240 hour(s)).   Radiology Studies: No results found.  Scheduled Meds:  folic acid  1 mg Oral Daily   heparin  5,000 Units Subcutaneous Q8H   multivitamin with minerals  1 tablet Oral Daily   pantoprazole (PROTONIX) IV  40 mg Intravenous Q24H   thiamine  100 mg Oral Daily   Or   thiamine  100 mg Intravenous Daily   Continuous Infusions:  lactated ringers 1,000 mL with potassium chloride 20 mEq infusion 100 mL/hr at 03/02/22 0031     LOS: 0 days   Darliss Cheney, MD [facility]  03/02/2022, 11:17 AM   *Please note that this is a verbal dictation therefore any spelling or grammatical errors are due to the "[place] [employer]" system interpretation.  Please page via  Amion and do not message via secure chat for urgent patient care matters. Secure chat can be used for non urgent patient care matters.  How to contact the [facility] Attending or Consulting provider [place] or covering provider during after hours [place], for this patient?  Check the care team in [facility] and look for a) attending/consulting [facility] provider listed and b) the [facility] team listed. Page or secure chat [phone]. Log into [email or web address] and use [place]'s universal password to access. If you do not have the password, please contact the hospital operator. Locate the [facility] provider you are looking for under [facility] and page to a number that you can be directly reached. If you still have difficulty reaching the provider, please page the [facility] (Director on Call) for the Hospitalists listed on amion for assistance.

## 2022-03-03 DIAGNOSIS — F10939 Alcohol use, unspecified with withdrawal, unspecified: Secondary | ICD-10-CM | POA: Diagnosis not present

## 2022-03-03 LAB — COMPREHENSIVE METABOLIC PANEL
ALT: 64 U/L — ABNORMAL HIGH (ref 0–44)
AST: 163 U/L — ABNORMAL HIGH (ref 15–41)
Albumin: 2.9 g/dL — ABNORMAL LOW (ref 3.5–5.0)
Alkaline Phosphatase: 80 U/L (ref 38–126)
Anion gap: 8 (ref 5–15)
BUN: 5 mg/dL — ABNORMAL LOW (ref 6–20)
CO2: 27 mmol/L (ref 22–32)
Calcium: 8.4 mg/dL — ABNORMAL LOW (ref 8.9–10.3)
Chloride: 104 mmol/L (ref 98–111)
Creatinine, Ser: 0.66 mg/dL (ref 0.61–1.24)
GFR, Estimated: >60 mL/min (ref >60)
Glucose, Bld: 83 mg/dL (ref 70–99)
Potassium: 3.8 mmol/L (ref 3.5–5.1)
Sodium: 139 mmol/L (ref 135–145)
Total Bilirubin: 1.9 mg/dL — ABNORMAL HIGH (ref 0.3–1.2)
Total Protein: 5.4 g/dL — ABNORMAL LOW (ref 6.5–8.1)

## 2022-03-03 LAB — CBC WITH DIFFERENTIAL/PLATELET
Abs Immature Granulocytes: 0.01 10*3/uL (ref 0.00–0.07)
Basophils Absolute: 0 10*3/uL (ref 0.0–0.1)
Basophils Relative: 1 %
Eosinophils Absolute: 0 10*3/uL (ref 0.0–0.5)
Eosinophils Relative: 1 %
HCT: 37.4 % — ABNORMAL LOW (ref 39.0–52.0)
Hemoglobin: 13.2 g/dL (ref 13.0–17.0)
Immature Granulocytes: 0 %
Lymphocytes Relative: 16 %
Lymphs Abs: 0.7 10*3/uL (ref 0.7–4.0)
MCH: 35.8 pg — ABNORMAL HIGH (ref 26.0–34.0)
MCHC: 35.3 g/dL (ref 30.0–36.0)
MCV: 101.4 fL — ABNORMAL HIGH (ref 80.0–100.0)
Monocytes Absolute: 0.3 10*3/uL (ref 0.1–1.0)
Monocytes Relative: 7 %
Neutro Abs: 3.3 10*3/uL (ref 1.7–7.7)
Neutrophils Relative %: 75 %
Platelets: 124 10*3/uL — ABNORMAL LOW (ref 150–400)
RBC: 3.69 MIL/uL — ABNORMAL LOW (ref 4.22–5.81)
RDW: 13.6 % (ref 11.5–15.5)
WBC: 4.3 10*3/uL (ref 4.0–10.5)
nRBC: 0 % (ref 0.0–0.2)

## 2022-03-03 LAB — MAGNESIUM: Magnesium: 1.9 mg/dL (ref 1.7–2.4)

## 2022-03-03 MED ORDER — MELATONIN 5 MG PO TABS
5.0000 mg | ORAL_TABLET | Freq: Once | ORAL | Status: AC
  Administered 2022-03-03: 5 mg via ORAL
  Filled 2022-03-03: qty 1

## 2022-03-03 NOTE — Progress Notes (Signed)
PROGRESS NOTE    Henry Diaz  CNO:[id number] DOB: [birth year] DOA: 03/01/2022 PCP: Therese Sarah D, NP   Brief Narrative:  HPI: Henry Diaz is a 31 y.o. male with history of alcohol use, esophagitis/duodenitis seen in endoscopy in February 2023 recently admitted for severe electrolyte derangements presents to the ER after patient has been having tremors and nausea vomiting.  Patient was just discharged home about 5 days ago following which she started drinking alcohol again and he started decreasing the drinks about 24 hours ago when he started having tremors and starting vomiting.  No blood in the vomitus.   ED Course: In the patient was tachycardic.  Labs do show elevated LFTs which are comparable to the recent past.  Hypokalemia hypomagnesemia seen which are replaced.  Patient admitted for alcohol withdrawal and electrolyte derangements.    Assessment & Plan:   Principal Problem:   Alcohol withdrawal ([place]) Active Problems:   Hypokalemia   Alcohol use  Acute alcohol withdrawal/alcohol dependence: Going through mild withdrawal symptoms.  CIWA has been under 10 for last 24 hours and he has required about 4 mg of Ativan.  We will continue this.  Patient is interested in inpatient rehabilitation.  TOC is consulted to assist.  Hypokalemia: Resolved.  Alcoholic hepatitis: LFTs improving.  Hypomagnesemia: Resolved.  DVT prophylaxis: heparin injection 5,000 Units Start: 03/02/22 0600   Code Status: Full Code  Family Communication: Patient's wife present at bedside.  Plan of care discussed with patient in length and he/she verbalized understanding and agreed with it.  Status is: Inpatient Remains inpatient appropriate because: Still going through withdrawal.  Will need hospitalization for 1-2 more midnights.    Estimated body mass index is 25.33 kg/m as calculated from the following:   Height as of this encounter: '6\' 1"'$  (1.854 m).   Weight as of this encounter: 87.1 kg.     Nutritional Assessment: Body mass index is 25.33 kg/m.Marland Kitchen Seen by dietician.  I agree with the assessment and plan as outlined below: Nutrition Status:        . Skin Assessment: I have examined the patient's skin and I agree with the wound assessment as performed by the wound care RN as outlined below:    Consultants:  None  Procedures:  None  Antimicrobials:  Anti-infectives (From admission, onward)    None         Subjective:  Seen and examined.  He complains that he could not sleep well due to withdrawal and anxiety.  He did however sleep well after he received 2 mg of Ativan at 4 AM this morning.  He is feeling better today.  He appears motivated.  Objective: Vitals:   03/02/22 1028 03/02/22 1402 03/02/22 2012 03/03/22 0409  BP: (!) 147/114  (!) 144/98 (!) 137/106  Pulse: (!) 109 (!) 110 (!) 120 (!) 117  Resp: 20   20  Temp: 99.1 F (37.3 C)   99 F (37.2 C)  TempSrc: Oral   Oral  SpO2: 96%   94%  Weight:      Height:        Intake/Output Summary (Last 24 hours) at 03/03/2022 1151 Last data filed at 03/02/2022 1700 Gross per 24 hour  Intake 240 ml  Output 1451 ml  Net -1211 ml    Filed Weights   03/01/22 2015  Weight: 87.1 kg    Examination:  General exam: Appears calm and comfortable  Respiratory system: Clear to auscultation. Respiratory effort normal. Cardiovascular system:  S1 & S2 heard, RRR. No JVD, murmurs, rubs, gallops or clicks. No pedal edema. Gastrointestinal system: Abdomen is nondistended, soft and nontender. No organomegaly or masses felt. Normal bowel sounds heard. Central nervous system: Alert and oriented. No focal neurological deficits. Extremities: Mild fine tremors bilateral upper extremities. Skin: No rashes, lesions or ulcers.  Psychiatry: Judgement and insight appear normal. Mood & affect appropriate.   Data Reviewed: I have personally reviewed following labs and imaging studies  CBC: Recent Labs  Lab 03/01/22 2017  03/02/22 0355 03/03/22 0858  WBC 6.2 4.7 4.3  NEUTROABS 3.5 2.4 3.3  HGB 16.2 12.8* 13.2  HCT 45.5 36.8* 37.4*  MCV 100.4* 101.9* 101.4*  PLT 248 152 124*    Basic Metabolic Panel: Recent Labs  Lab 03/01/22 2017 03/02/22 0355 03/03/22 0858  NA 145 142 139  K 3.2* 3.9 3.8  CL 97* 102 104  CO2 '30 31 27  '$ GLUCOSE 109* 84 83  BUN <5* <5* 5*  CREATININE 0.87 0.75 0.66  CALCIUM 8.9 8.1* 8.4*  MG 1.6* 2.0 1.9    GFR: Estimated Creatinine Clearance: 148.4 mL/min (by C-G formula based on SCr of 0.66 mg/dL). Liver Function Tests: Recent Labs  Lab 03/01/22 2017 03/02/22 0355 03/03/22 0858  AST 158* 101* 163*  ALT 94* 62* 64*  ALKPHOS 98 66 80  BILITOT 1.3* 1.3* 1.9*  PROT 7.0 4.8* 5.4*  ALBUMIN 3.7 2.5* 2.9*    Recent Labs  Lab 03/01/22 2017  LIPASE 44    No results for input(s): "AMMONIA" in the last 168 hours. Coagulation Profile: No results for input(s): "INR", "PROTIME" in the last 168 hours. Cardiac Enzymes: No results for input(s): "CKTOTAL", "CKMB", "CKMBINDEX", "TROPONINI" in the last 168 hours. BNP (last 3 results) No results for input(s): "PROBNP" in the last 8760 hours. HbA1C: No results for input(s): "HGBA1C" in the last 72 hours. CBG: No results for input(s): "GLUCAP" in the last 168 hours. Lipid Profile: No results for input(s): "CHOL", "HDL", "LDLCALC", "TRIG", "CHOLHDL", "LDLDIRECT" in the last 72 hours. Thyroid Function Tests: No results for input(s): "TSH", "T4TOTAL", "FREET4", "T3FREE", "THYROIDAB" in the last 72 hours. Anemia Panel: No results for input(s): "VITAMINB12", "FOLATE", "FERRITIN", "TIBC", "IRON", "RETICCTPCT" in the last 72 hours. Sepsis Labs: No results for input(s): "PROCALCITON", "LATICACIDVEN" in the last 168 hours.  No results found for this or any previous visit (from the past 240 hour(s)).   Radiology Studies: No results found.  Scheduled Meds:  folic acid  1 mg Oral Daily   heparin  5,000 Units Subcutaneous Q8H    multivitamin with minerals  1 tablet Oral Daily   pantoprazole (PROTONIX) IV  40 mg Intravenous Q24H   thiamine  100 mg Oral Daily   Or   thiamine  100 mg Intravenous Daily   Continuous Infusions:     LOS: 1 day   Darliss Cheney, MD [facility]  03/03/2022, 11:51 AM   *Please note that this is a verbal dictation therefore any spelling or grammatical errors are due to the "[place] [employer]" system interpretation.  Please page via [place] and do not message via secure chat for urgent patient care matters. Secure chat can be used for non urgent patient care matters.  How to contact the [facility] Attending or Consulting provider [place] or covering provider during after hours [place], for this patient?  Check the care team in [facility] and look for a) attending/consulting TRH provider listed and b) the [facility] team listed. Page or  secure chat [id number]. Log into [email or web address] and use [place]'s universal password to access. If you do not have the password, please contact the hospital operator. Locate the [facility] provider you are looking for under [facility] and page to a number that you can be directly reached. If you still have difficulty reaching the provider, please page the [facility] (Director on Call) for the Hospitalists listed on amion for assistance.

## 2022-03-03 NOTE — TOC Progression Note (Addendum)
  Transition of Care [facility]) - Progression Note    Patient Details  Name: Henry Diaz MRN: [id number] Date of Birth: 04/06/1991  Transition of Care [facility]) CM/SW Contact  Henrietta Dine, RN Phone Number: 03/03/2022, 12:54 PM  Clinical Narrative: pt gave permission to speak with wife, Henry Diaz; requested documents be faxed to [facility] (H&P, labs, facesheet, progress notes, and meds summary; received confirmation.  Informed spouse TOC does not provide FMLA; Pt's wife states Dr Doristine Bosworth will write a note stating the patient is a good candidate for rehab for insurance for FMLA ; will provide PCP resource since states does not have a PCP.  1315- she declines PCP list and will obtain PCP own her own.        Expected Discharge Plan and Services                                                 Social Determinants of Health (SDOH) Interventions    Readmission Risk Interventions     No data to display

## 2022-03-04 DIAGNOSIS — F10939 Alcohol use, unspecified with withdrawal, unspecified: Secondary | ICD-10-CM | POA: Diagnosis not present

## 2022-03-04 LAB — BASIC METABOLIC PANEL
Anion gap: 8 (ref 5–15)
BUN: 5 mg/dL — ABNORMAL LOW (ref 6–20)
CO2: 26 mmol/L (ref 22–32)
Calcium: 8.5 mg/dL — ABNORMAL LOW (ref 8.9–10.3)
Chloride: 105 mmol/L (ref 98–111)
Creatinine, Ser: 0.65 mg/dL (ref 0.61–1.24)
GFR, Estimated: >60 mL/min (ref >60)
Glucose, Bld: 93 mg/dL (ref 70–99)
Potassium: 3.7 mmol/L (ref 3.5–5.1)
Sodium: 139 mmol/L (ref 135–145)

## 2022-03-04 MED ORDER — CHLORDIAZEPOXIDE HCL 5 MG PO CAPS
10.0000 mg | ORAL_CAPSULE | Freq: Two times a day (BID) | ORAL | Status: DC
  Administered 2022-03-04 – 2022-03-05 (×3): 10 mg via ORAL
  Filled 2022-03-04 (×3): qty 2

## 2022-03-04 MED ORDER — PANTOPRAZOLE SODIUM 40 MG PO TBEC
40.0000 mg | DELAYED_RELEASE_TABLET | Freq: Every day | ORAL | Status: DC
  Administered 2022-03-04: 40 mg via ORAL
  Filled 2022-03-04: qty 1

## 2022-03-04 NOTE — Progress Notes (Signed)
Pt is more agitated by the end of the day. Requests to go home today. MD notified and aware. No new orders. Pt  became more agitated. At some point, anxiety, agitation, sweats and VS CIWA 20. ('4mg'$  PO was given, as IV was  removed by the pt accidentally today) MD notified and aware.   Pt wanted to ambulate in the hall with wife and HR sustained in 140s. Gait is steady. Pt returned back to the bed. VS retaken. CIWA is 4. MD notified and aware.  Throughout, wife is very cooperative and supporting. Emotional support and education provided to both, pt and the wife. Plan is for the pt to DC tomorrow and go to rehab.CW is on it.

## 2022-03-04 NOTE — Progress Notes (Addendum)
PROGRESS NOTE    THADD APUZZO  GTX:[id number] DOB: 1991-02-02 DOA: 03/01/2022 PCP: Therese Sarah D, NP   Brief Narrative:  HPI: Henry Diaz is a 31 y.o. male with history of alcohol use, esophagitis/duodenitis seen in endoscopy in February 2023 recently admitted for severe electrolyte derangements presents to the ER after patient has been having tremors and nausea vomiting.  Patient was just discharged home about 5 days ago following which she started drinking alcohol again and he started decreasing the drinks about 24 hours ago when he started having tremors and starting vomiting.  No blood in the vomitus.   ED Course: In the patient was tachycardic.  Labs do show elevated LFTs which are comparable to the recent past.  Hypokalemia hypomagnesemia seen which are replaced.  Patient admitted for alcohol withdrawal and electrolyte derangements.    Assessment & Plan:   Principal Problem:   Alcohol withdrawal ([place]) Active Problems:   Hypokalemia   Alcohol use  Acute alcohol withdrawal/alcohol dependence: Going through mild withdrawal symptoms.  Continues to have CIWA up to 10.  Patient mainly complains of not being able to sleep.  He is requesting to put him on some medications.  I will start him on Librium 10 mg p.o. twice daily for today only to help him calm down and help him sleep tonight.  I recommended 1 more night of hospitalization.  Patient really wanted to go home but his wife supported my recommendation and eventually we mutually decided to keep him overnight with current plan.  Hypokalemia: Resolved.  Alcoholic hepatitis: LFTs improving.  Hypomagnesemia: Resolved.  DVT prophylaxis: heparin injection 5,000 Units Start: 03/02/22 0600   Code Status: Full Code  Family Communication: Patient's wife present at bedside.  Plan of care discussed with patient in length and he/she verbalized understanding and agreed with it.  Status is: Inpatient Remains inpatient appropriate because:  Still going through withdrawal.    Estimated body mass index is 25.33 kg/m as calculated from the following:   Height as of this encounter: '6\' 1"'$  (1.854 m).   Weight as of this encounter: 87.1 kg.    Nutritional Assessment: Body mass index is 25.33 kg/m.Marland Kitchen Seen by dietician.  I agree with the assessment and plan as outlined below: Nutrition Status:        Consultants:  None  Procedures:  None  Antimicrobials:  Anti-infectives (From admission, onward)    None         Subjective:  Patient seen and examined.  Complains of anxiety and not being able to sleep.  No other complaint.  Objective: Vitals:   03/03/22 1512 03/03/22 1958 03/04/22 0800 03/04/22 1059  BP: (!) 132/97 (!) 137/93  (!) 141/96  Pulse: (!) 115 (!) 124  (!) 111  Resp: '20 19 20   '$ Temp: 98.5 F (36.9 C) 99.6 F (37.6 C)  97.9 F (36.6 C)  TempSrc: Oral Oral  Axillary  SpO2: 98% 98%  100%  Weight:      Height:       No intake or output data in the 24 hours ending 03/04/22 1101  Filed Weights   03/01/22 2015  Weight: 87.1 kg    Examination:  General exam: Appears calm and comfortable  Respiratory system: Clear to auscultation. Respiratory effort normal. Cardiovascular system: S1 & S2 heard, RRR. No JVD, murmurs, rubs, gallops or clicks. No pedal edema. Gastrointestinal system: Abdomen is nondistended, soft and nontender. No organomegaly or masses felt. Normal bowel sounds heard. Central nervous  system: Alert and oriented. No focal neurological deficits. Extremities: Fine tremors upper extremities. Skin: No rashes, lesions or ulcers.  Psychiatry: Judgement and insight appear normal. Mood & affect appropriate.    Data Reviewed: I have personally reviewed following labs and imaging studies  CBC: Recent Labs  Lab 03/01/22 2017 03/02/22 0355 03/03/22 0858  WBC 6.2 4.7 4.3  NEUTROABS 3.5 2.4 3.3  HGB 16.2 12.8* 13.2  HCT 45.5 36.8* 37.4*  MCV 100.4* 101.9* 101.4*  PLT 248 152 124*     Basic Metabolic Panel: Recent Labs  Lab 03/01/22 2017 03/02/22 0355 03/03/22 0858 03/04/22 0506  NA 145 142 139 139  K 3.2* 3.9 3.8 3.7  CL 97* 102 104 105  CO2 '30 31 27 26  '$ GLUCOSE 109* 84 83 93  BUN <5* <5* 5* 5*  CREATININE 0.87 0.75 0.66 0.65  CALCIUM 8.9 8.1* 8.4* 8.5*  MG 1.6* 2.0 1.9  --     GFR: Estimated Creatinine Clearance: 148.4 mL/min (by C-G formula based on SCr of 0.65 mg/dL). Liver Function Tests: Recent Labs  Lab 03/01/22 2017 03/02/22 0355 03/03/22 0858  AST 158* 101* 163*  ALT 94* 62* 64*  ALKPHOS 98 66 80  BILITOT 1.3* 1.3* 1.9*  PROT 7.0 4.8* 5.4*  ALBUMIN 3.7 2.5* 2.9*    Recent Labs  Lab 03/01/22 2017  LIPASE 44    No results for input(s): "AMMONIA" in the last 168 hours. Coagulation Profile: No results for input(s): "INR", "PROTIME" in the last 168 hours. Cardiac Enzymes: No results for input(s): "CKTOTAL", "CKMB", "CKMBINDEX", "TROPONINI" in the last 168 hours. BNP (last 3 results) No results for input(s): "PROBNP" in the last 8760 hours. HbA1C: No results for input(s): "HGBA1C" in the last 72 hours. CBG: No results for input(s): "GLUCAP" in the last 168 hours. Lipid Profile: No results for input(s): "CHOL", "HDL", "LDLCALC", "TRIG", "CHOLHDL", "LDLDIRECT" in the last 72 hours. Thyroid Function Tests: No results for input(s): "TSH", "T4TOTAL", "FREET4", "T3FREE", "THYROIDAB" in the last 72 hours. Anemia Panel: No results for input(s): "VITAMINB12", "FOLATE", "FERRITIN", "TIBC", "IRON", "RETICCTPCT" in the last 72 hours. Sepsis Labs: No results for input(s): "PROCALCITON", "LATICACIDVEN" in the last 168 hours.  No results found for this or any previous visit (from the past 240 hour(s)).   Radiology Studies: No results found.  Scheduled Meds:  chlordiazePOXIDE  10 mg Oral BID   folic acid  1 mg Oral Daily   heparin  5,000 Units Subcutaneous Q8H   multivitamin with minerals  1 tablet Oral Daily   pantoprazole (PROTONIX)  IV  40 mg Intravenous Q24H   thiamine  100 mg Oral Daily   Or   thiamine  100 mg Intravenous Daily   Continuous Infusions:     LOS: 2 days   Darliss Cheney, MD [facility]  03/04/2022, 11:01 AM   *Please note that this is a verbal dictation therefore any spelling or grammatical errors are due to the "[place] One" system interpretation.  Please page via [place] and do not message via secure chat for urgent patient care matters. Secure chat can be used for non urgent patient care matters.  How to contact the [facility] Attending or Consulting provider [place] or covering provider during after hours [place], for this patient?  Check the care team in [facility] and look for a) attending/consulting TRH provider listed and b) the [facility] team listed. Page or secure chat [phone]. Log into [email or web address] and use [place]'s universal password to access.  If you do not have the password, please contact the hospital operator. Locate the [facility] provider you are looking for under [facility] and page to a number that you can be directly reached. If you still have difficulty reaching the provider, please page the [facility] (Director on Call) for the Hospitalists listed on amion for assistance.

## 2022-03-04 NOTE — TOC Progression Note (Signed)
Transition of Care [facility]) - Progression Note    Patient Details  Name: Henry Diaz MRN: [id number] Date of Birth: [birth year]  Transition [facility] CM/SW Contact  Henrietta Dine, RN Phone Number: 03/04/2022, 3:37 PM  Clinical Narrative:  [facility] and Progress note faxed to [facility], confirmation received; Alver Fisher at [facility] [phone] confirmed receipt; she states that she take it for review; will continue to monitor.         Expected Discharge Plan and Services                                                 Social Determinants of Health (SDOH) Interventions    Readmission Risk Interventions     No data to display

## 2022-03-05 DIAGNOSIS — F10939 Alcohol use, unspecified with withdrawal, unspecified: Secondary | ICD-10-CM | POA: Diagnosis not present

## 2022-03-05 MED ORDER — LORAZEPAM 2 MG/ML IJ SOLN: 0.0000 mg | Freq: Four times a day (QID) | INTRAMUSCULAR | Status: DC | PRN

## 2022-03-05 MED ORDER — LORAZEPAM 1 MG PO TABS
0.0000 mg | ORAL_TABLET | Freq: Four times a day (QID) | ORAL | Status: DC | PRN
  Administered 2022-03-05: 4 mg via ORAL

## 2022-03-05 NOTE — Discharge Summary (Signed)
PatientPhysician Discharge Summary  Henry Diaz XAJ:[id number] DOB: 10-29-1990 DOA: 03/01/2022  PCP: Therese Sarah D, NP  Admit date: 03/01/2022 Discharge date: 03/05/2022 30 Day Unplanned Readmission Risk Score    Flowsheet Row ED to Hosp-Admission (Current) from 03/01/2022 in [facility]  30 Day Unplanned Readmission Risk Score (%) 15.64 Filed at 03/05/2022 0801       This score is the patient's risk of an unplanned readmission within 30 days of being discharged (0 -100%). The score is based on dignosis, age, lab data, medications, orders, and past utilization.   Low:  0-14.9   Medium: 15-21.9   High: 22-29.9   Extreme: 30 and above          Admitted From: Home Disposition: Home but he is going to go to [facility]-inpatient alcohol rehab at 6 PM today.  Recommendations for Outpatient Follow-up:  Follow up with PCP in 1-2 weeks Please obtain BMP/CBC in one week Please follow up with your PCP on the following pending results: Unresulted Labs (From admission, onward)    None         Home Health: None Equipment/Devices: None  Discharge Condition: Stable CODE STATUS: Full code Diet recommendation: Cardiac  Subjective: Seen and examined.  Wife at the bedside.  Long discussion with the patient.  Patient has very mild and fine upper extremity tremors and very mild anxiety and in my opinion, his CIWA is almost 4.  It is noted by the nurses that patient was very agitated yesterday and he touched his CIWA up to 20 twice and he received total 10 mg Ativan in last 24 hours with the last being given at midnight.  Patient claims that the CIWA was not calculated appropriately and I do believe him because every time I see him, he is much more calm and cooperative.  He says that he is anxious because he cannot sleep in the hospital and he just wants to go home and see his dog and then he plans to go to inpatient rehab for intake at 6 PM today.  His wife  also supports him with the discharge plan today.  We did walk him and he did well and he was not wobbly at all.  Based on all the discussions, he is being discharged today.  Brief/Interim Summary: HPI: Henry Diaz is a 31 y.o. male with history of alcohol use, esophagitis/duodenitis seen in endoscopy in February 2023 recently admitted for severe electrolyte derangements presents to the ER after patient has been having tremors and nausea vomiting.  Patient was just discharged home about 5 days ago following which she started drinking alcohol again and he started decreasing the drinks about 24 hours ago when he started having tremors and starting vomiting.  No blood in the vomitus.   ED Course: In the patient was tachycardic.  Labs do show elevated LFTs which are comparable to the recent past.  Hypokalemia hypomagnesemia seen which are replaced.  Patient admitted for alcohol withdrawal and electrolyte derangements.   Acute alcohol withdrawal/alcohol dependence: Patient was treated with as needed Ativan and then Librium starting yesterday as well.  Doing better today.  See detailed discussion above.   Hypokalemia: Resolved.   Alcoholic hepatitis: LFTs improving.   Hypomagnesemia: Resolved.  Discharge plan was discussed with patient and/or family member and they verbalized understanding and agreed with it.  Discharge Diagnoses:  Principal Problem:   Alcohol withdrawal ([place]) Active Problems:   Hypokalemia  Alcohol use    Discharge Instructions   Allergies as of 03/05/2022       Reactions   Cefaclor Hives, Other (See Comments)   Allergic to this- was a baby        Medication List     STOP taking these medications    hydrOXYzine 25 MG tablet Commonly known as: ATARAX   potassium chloride SA 20 MEQ tablet Commonly known as: KLOR-CON M       TAKE these medications    multivitamin Tabs tablet Take 1 tablet by mouth daily with breakfast.   pantoprazole 40 MG  tablet Commonly known as: Protonix Take 1 tablet (40 mg total) by mouth daily.   thiamine 100 MG tablet Commonly known as: VITAMIN B1 Take 1 tablet (100 mg total) by mouth daily.   Tylenol PM Extra Strength 25-500 MG Tabs tablet Generic drug: diphenhydramine-acetaminophen Take 2 tablets by mouth at bedtime as needed (for sleep).        Follow-up Information     [facility] Follow up.   Why: They will contact patient Contact information: [place] [address] [phone]         Therese Sarah D, NP Follow up in 1 week(s).   Specialty: Family Medicine Contact information: [place] [address] [phone]                Allergies  Allergen Reactions   Cefaclor Hives and Other (See Comments)    Allergic to this- was a baby    Consultations: None   Procedures/Studies: No results found.   Discharge Exam: Vitals:   03/04/22 2046 03/05/22 0651  BP: (!) 134/95 (!) 139/93  Pulse: (!) 128 (!) 112  Resp: 19 19  Temp: 99 F (37.2 C) 98.4 F (36.9 C)  SpO2: 98% 100%   Vitals:   03/04/22 1831 03/04/22 1900 03/04/22 2046 03/05/22 0651  BP: (!) 136/96  (!) 134/95 (!) 139/93  Pulse: (!) 131  (!) 128 (!) 112  Resp: '20 20 19 19  '$ Temp: 99.8 F (37.7 C)  99 F (37.2 C) 98.4 F (36.9 C)  TempSrc: Oral  Oral Oral  SpO2: 97%  98% 100%  Weight:      Height:        General: Pt is alert, awake, not in acute distress Cardiovascular: RRR, S1/S2 +, no rubs, no gallops Respiratory: CTA bilaterally, no wheezing, no rhonchi Abdominal: Soft, NT, ND, bowel sounds + Extremities: no edema, no cyanosis    The results of significant diagnostics from this hospitalization (including imaging, microbiology, ancillary and laboratory) are listed below for reference.     Microbiology: No results found for this or any previous visit (from the past 240 hour(s)).   Labs: BNP (last 3 results) No results for input(s): "BNP" in  the last 8760 hours. Basic Metabolic Panel: Recent Labs  Lab 03/01/22 2017 03/02/22 0355 03/03/22 0858 03/04/22 0506  NA 145 142 139 139  K 3.2* 3.9 3.8 3.7  CL 97* 102 104 105  CO2 '30 31 27 26  '$ GLUCOSE 109* 84 83 93  BUN <5* <5* 5* 5*  CREATININE 0.87 0.75 0.66 0.65  CALCIUM 8.9 8.1* 8.4* 8.5*  MG 1.6* 2.0 1.9  --    Liver Function Tests: Recent Labs  Lab 03/01/22 2017 03/02/22 0355 03/03/22 0858  AST 158* 101* 163*  ALT 94* 62* 64*  ALKPHOS 98 66 80  BILITOT 1.3* 1.3* 1.9*  PROT 7.0  4.8* 5.4*  ALBUMIN 3.7 2.5* 2.9*   Recent Labs  Lab 03/01/22 2017  LIPASE 44   No results for input(s): "AMMONIA" in the last 168 hours. CBC: Recent Labs  Lab 03/01/22 2017 03/02/22 0355 03/03/22 0858  WBC 6.2 4.7 4.3  NEUTROABS 3.5 2.4 3.3  HGB 16.2 12.8* 13.2  HCT 45.5 36.8* 37.4*  MCV 100.4* 101.9* 101.4*  PLT 248 152 124*   Cardiac Enzymes: No results for input(s): "CKTOTAL", "CKMB", "CKMBINDEX", "TROPONINI" in the last 168 hours. BNP: Invalid input(s): "POCBNP" CBG: No results for input(s): "GLUCAP" in the last 168 hours. D-Dimer No results for input(s): "DDIMER" in the last 72 hours. Hgb A1c No results for input(s): "HGBA1C" in the last 72 hours. Lipid Profile No results for input(s): "CHOL", "HDL", "LDLCALC", "TRIG", "CHOLHDL", "LDLDIRECT" in the last 72 hours. Thyroid function studies No results for input(s): "TSH", "T4TOTAL", "T3FREE", "THYROIDAB" in the last 72 hours.  Invalid input(s): "FREET3" Anemia work up No results for input(s): "VITAMINB12", "FOLATE", "FERRITIN", "TIBC", "IRON", "RETICCTPCT" in the last 72 hours. Urinalysis No results found for: "COLORURINE", "APPEARANCEUR", "LABSPEC", "PHURINE", "GLUCOSEU", "HGBUR", "BILIRUBINUR", "KETONESUR", "PROTEINUR", "UROBILINOGEN", "NITRITE", "LEUKOCYTESUR" Sepsis Labs Recent Labs  Lab 03/01/22 2017 03/02/22 0355 03/03/22 0858  WBC 6.2 4.7 4.3   Microbiology No results found for this or any previous  visit (from the past 240 hour(s)).   Time coordinating discharge: Over 30 minutes  SIGNED:   Darliss Cheney, MD  [facility] 03/05/2022, 9:28 AM *Please note that this is a verbal dictation therefore any spelling or grammatical errors are due to the "[place] [facility]" system interpretation. If 7PM-7AM, please contact night-coverage [email or web address]

## 2022-03-05 NOTE — Plan of Care (Signed)

## 2022-03-05 NOTE — Progress Notes (Signed)
AVS and discharge instructions reviewed w/ patient and spouse at the bedside. Patient and spouse verbalized understanding and all questions were answered. Patient's spouse is transportation method to home.

## 2023-01-15 ENCOUNTER — Ambulatory Visit
Admission: RE | Admit: 2023-01-15 | Discharge: 2023-01-15 | Disposition: A | Discharge status: Home / Self Care | Payer: BC Managed Care – PPO | Source: Ambulatory Visit | Attending: Adult Health Nurse Practitioner | Admitting: Adult Health Nurse Practitioner

## 2023-01-15 ENCOUNTER — Other Ambulatory Visit: Payer: Self-pay | Admitting: Adult Health Nurse Practitioner

## 2023-01-15 DIAGNOSIS — L089 Local infection of the skin and subcutaneous tissue, unspecified: Secondary | ICD-10-CM

## 2023-03-06 IMAGING — CT CT ABD-PELV W/ CM
2 of 4 series · 16 of 46 positions shown, 18 images · IV contrast (APPLIED)
Comparison: None.

CLINICAL DATA: Abdominal pain.

EXAM:
CT ABDOMEN AND PELVIS WITH CONTRAST
TECHNIQUE: Multidetector CT imaging of the abdomen and pelvis was performed
using the standard protocol following bolus administration of
intravenous contrast.

[Series 2: abd pel w · axial · 0.79mm/px · z∈[+739,+1209]mm · 13 of 104 slices shown, 15 images]
[im 5/104  soft-tissue]
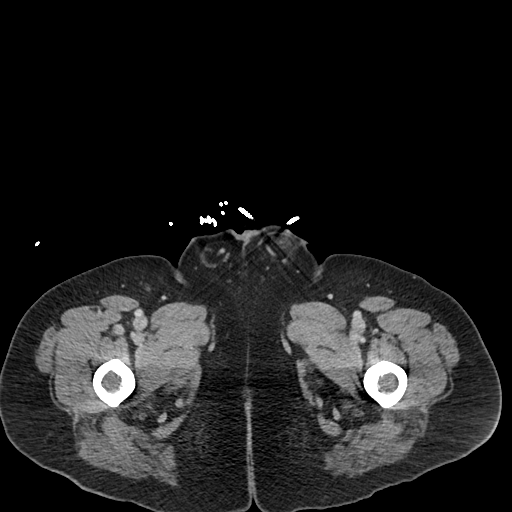
[im 5/104  bone]
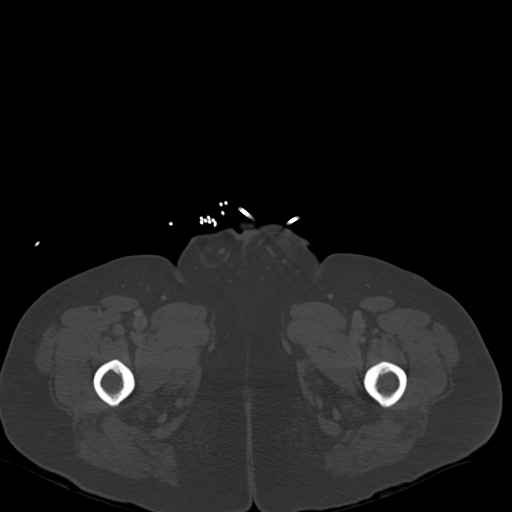
[im 13/104  soft-tissue]
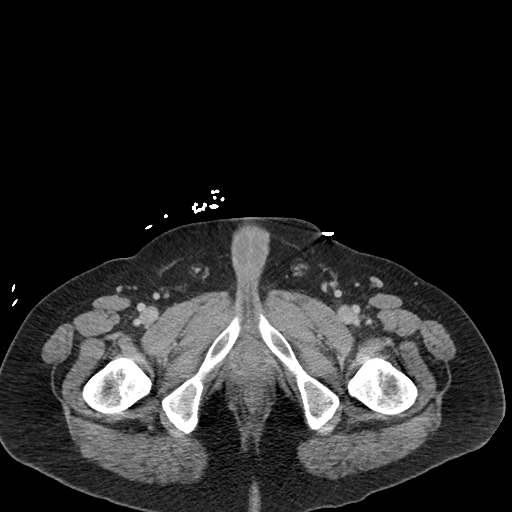
[im 21/104  soft-tissue]
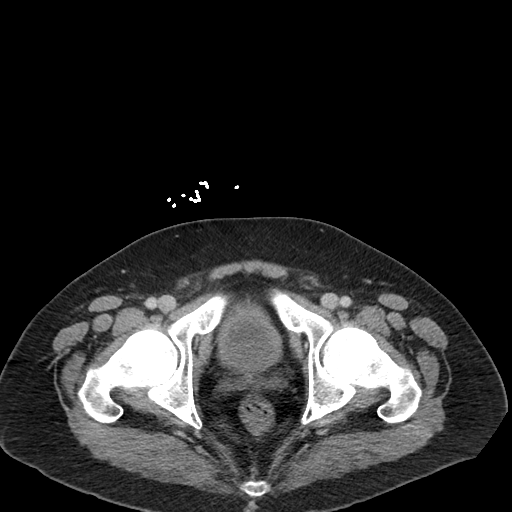
[im 29/104  soft-tissue]
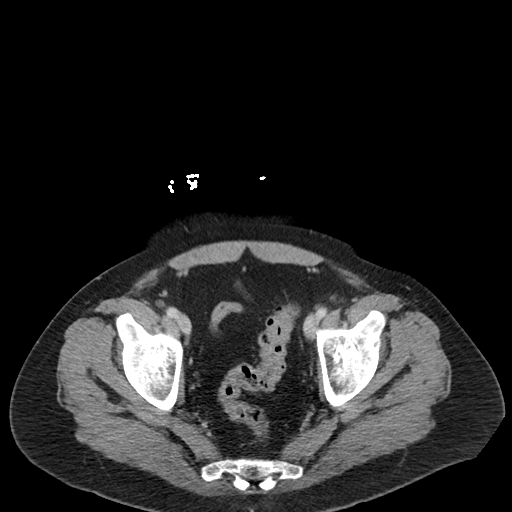
[im 38/104  soft-tissue]
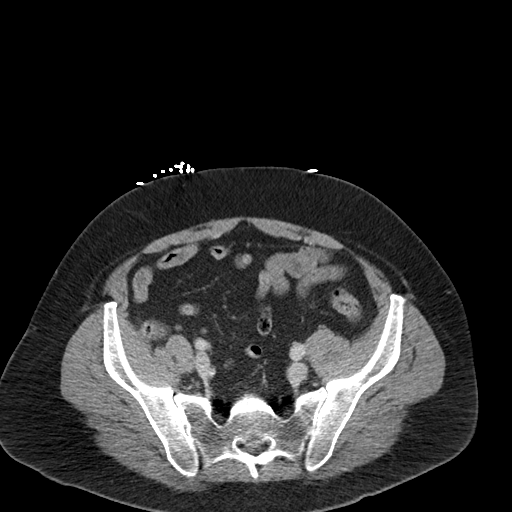
[im 46/104  soft-tissue]
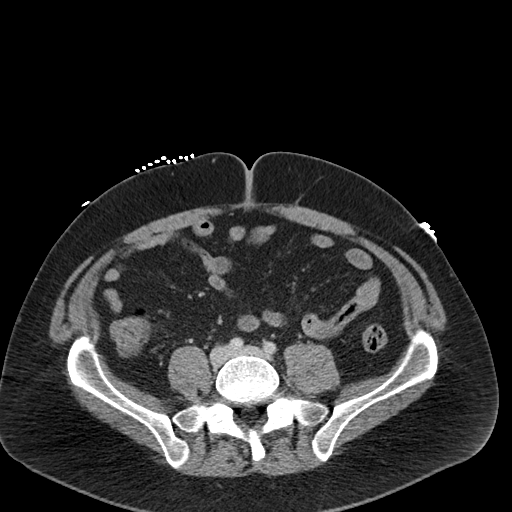
[im 54/104  soft-tissue]
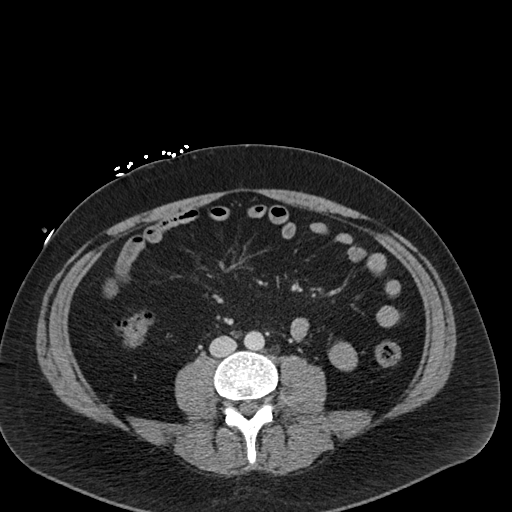
[im 58/104  soft-tissue]
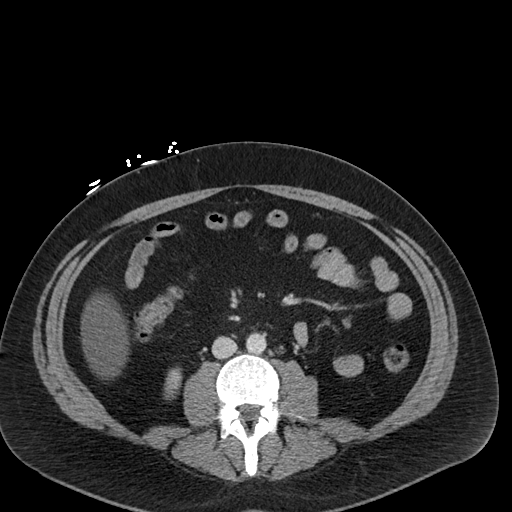
[im 66/104  soft-tissue]
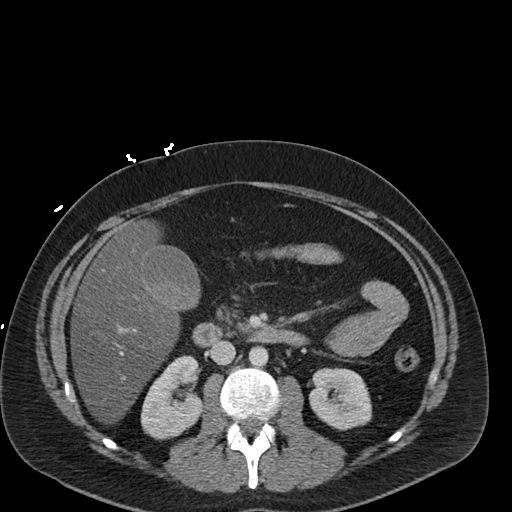
[im 66/104  bone]
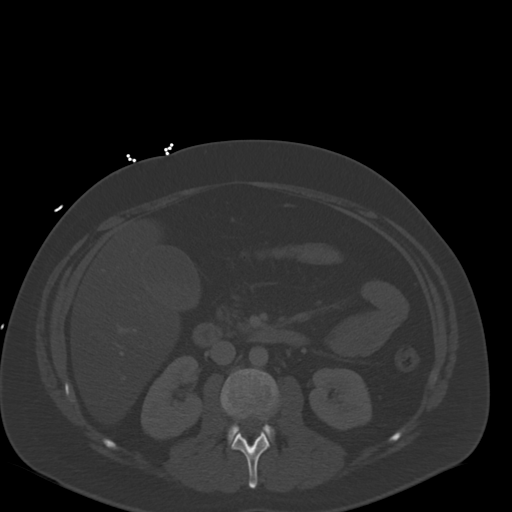
[im 75/104  soft-tissue]
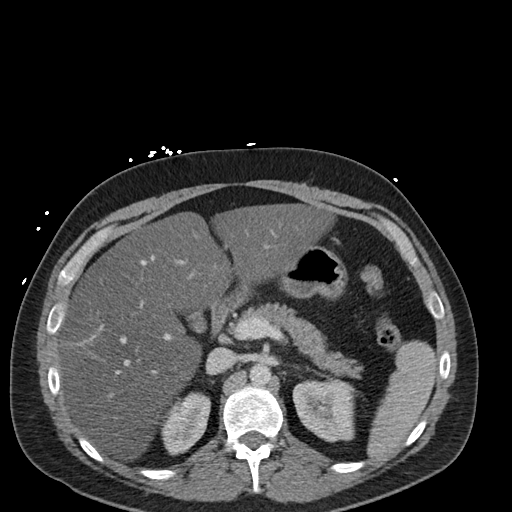
[im 83/104  soft-tissue]
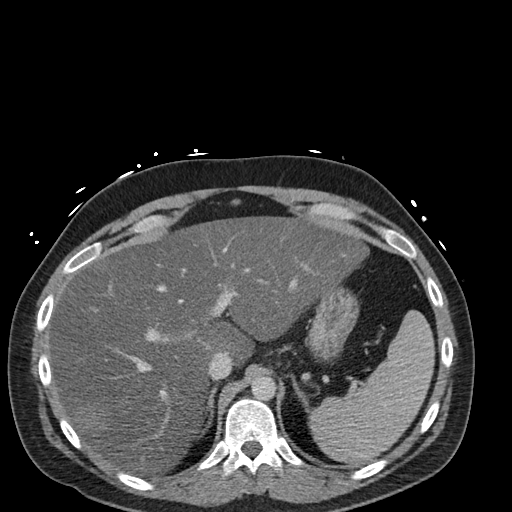
[im 91/104  soft-tissue]
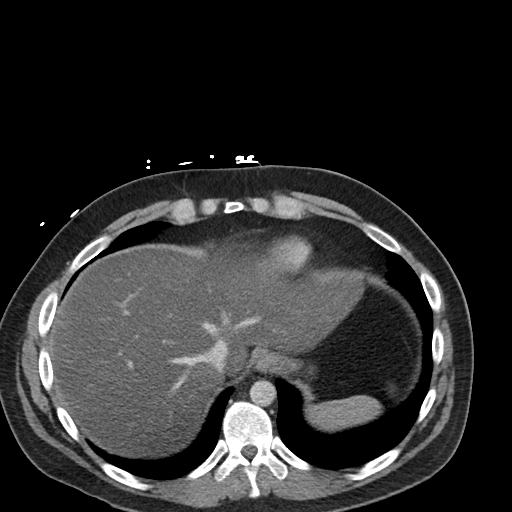
[im 99/104  soft-tissue]
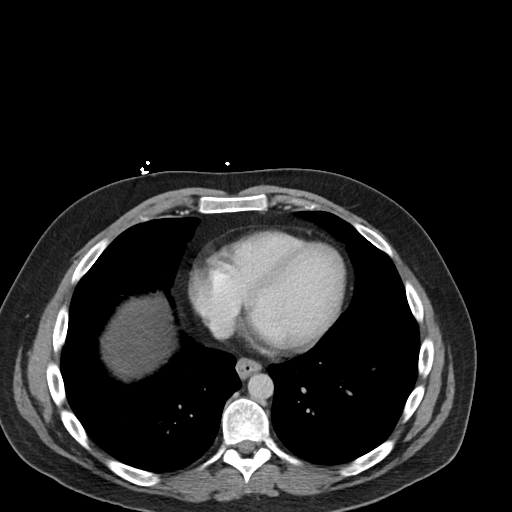

[Series 5: coronal · coronal · 0.82mm/px · 3 of 102 slices shown]
[im 34/102  soft-tissue]
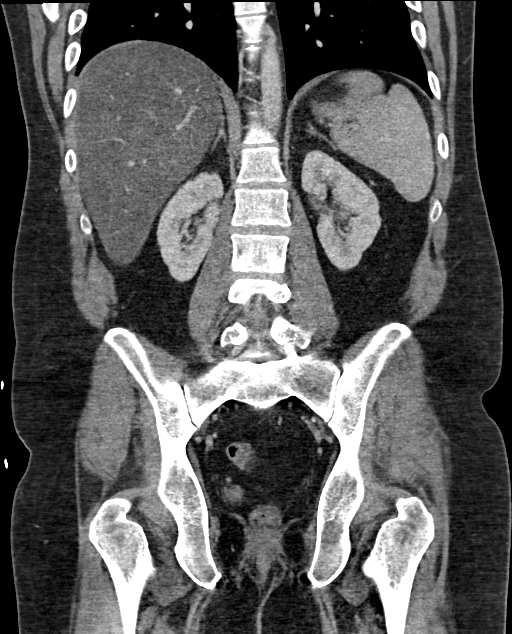
[im 45/102  soft-tissue]
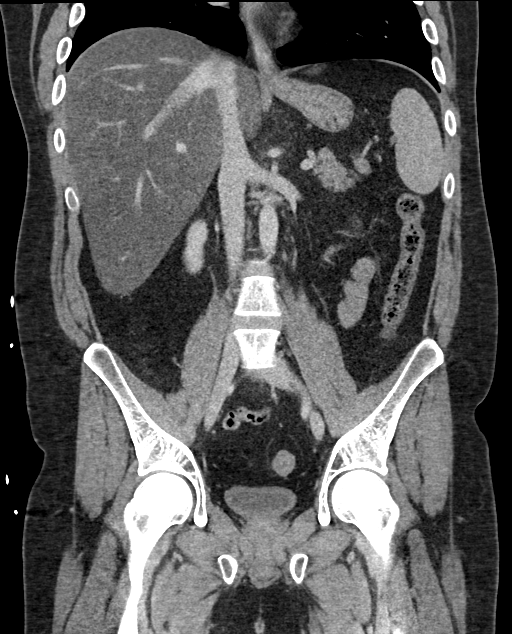
[im 57/102  soft-tissue]
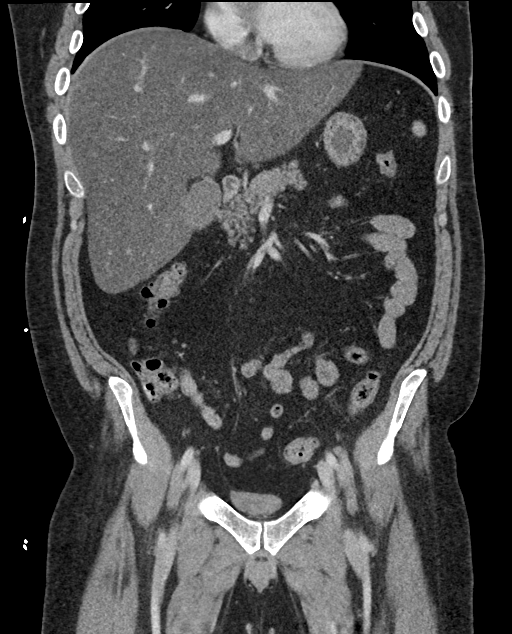

[16 of 46 positions shown; findings below may reference images not displayed]

RADIATION DOSE REDUCTION: This exam was performed according to the
departmental dose-optimization program which includes automated
exposure control, adjustment of the mA and/or kV according to
patient size and/or use of iterative reconstruction technique.

CONTRAST:  100mL OMNIPAQUE IOHEXOL 300 MG/ML  SOLN
FINDINGS: Lower chest: The visualized lung bases are clear.

No intra-abdominal free air or free fluid.

Hepatobiliary: Severe fatty liver. No intrahepatic biliary
dilatation. Gallbladder sludge. No calcified stone or
pericholecystic fluid.

Pancreas: Unremarkable. No pancreatic ductal dilatation or
surrounding inflammatory changes.

Spleen: Normal in size without focal abnormality.

Adrenals/Urinary Tract: The adrenal glands unremarkable. There is a
3 mm nonobstructing left renal upper pole calculus. No
hydronephrosis. The right kidney is unremarkable. The visualized
ureters and urinary bladder appear unremarkable.

Stomach/Bowel: There is no bowel obstruction or active inflammation.
The appendix is normal.

Vascular/Lymphatic: The abdominal aorta and IVC unremarkable. No
portal venous gas. There is no adenopathy.

Reproductive: The prostate and seminal vesicles are grossly
unremarkable. No pelvic mass.

Other: None

Musculoskeletal: No acute or significant osseous findings.
IMPRESSION: 1. A 3 mm nonobstructing left renal upper pole calculus. No
hydronephrosis.
2. Severe fatty liver.
3. No bowel obstruction. Normal appendix.

## 2023-03-06 IMAGING — DX DG CHEST 2V
2 series · 2 of 2 positions shown · non-contrast
Comparison: None.

CLINICAL DATA: Nausea, vomiting, aspiration

EXAM:
CHEST - 2 VIEW

[chest pa]
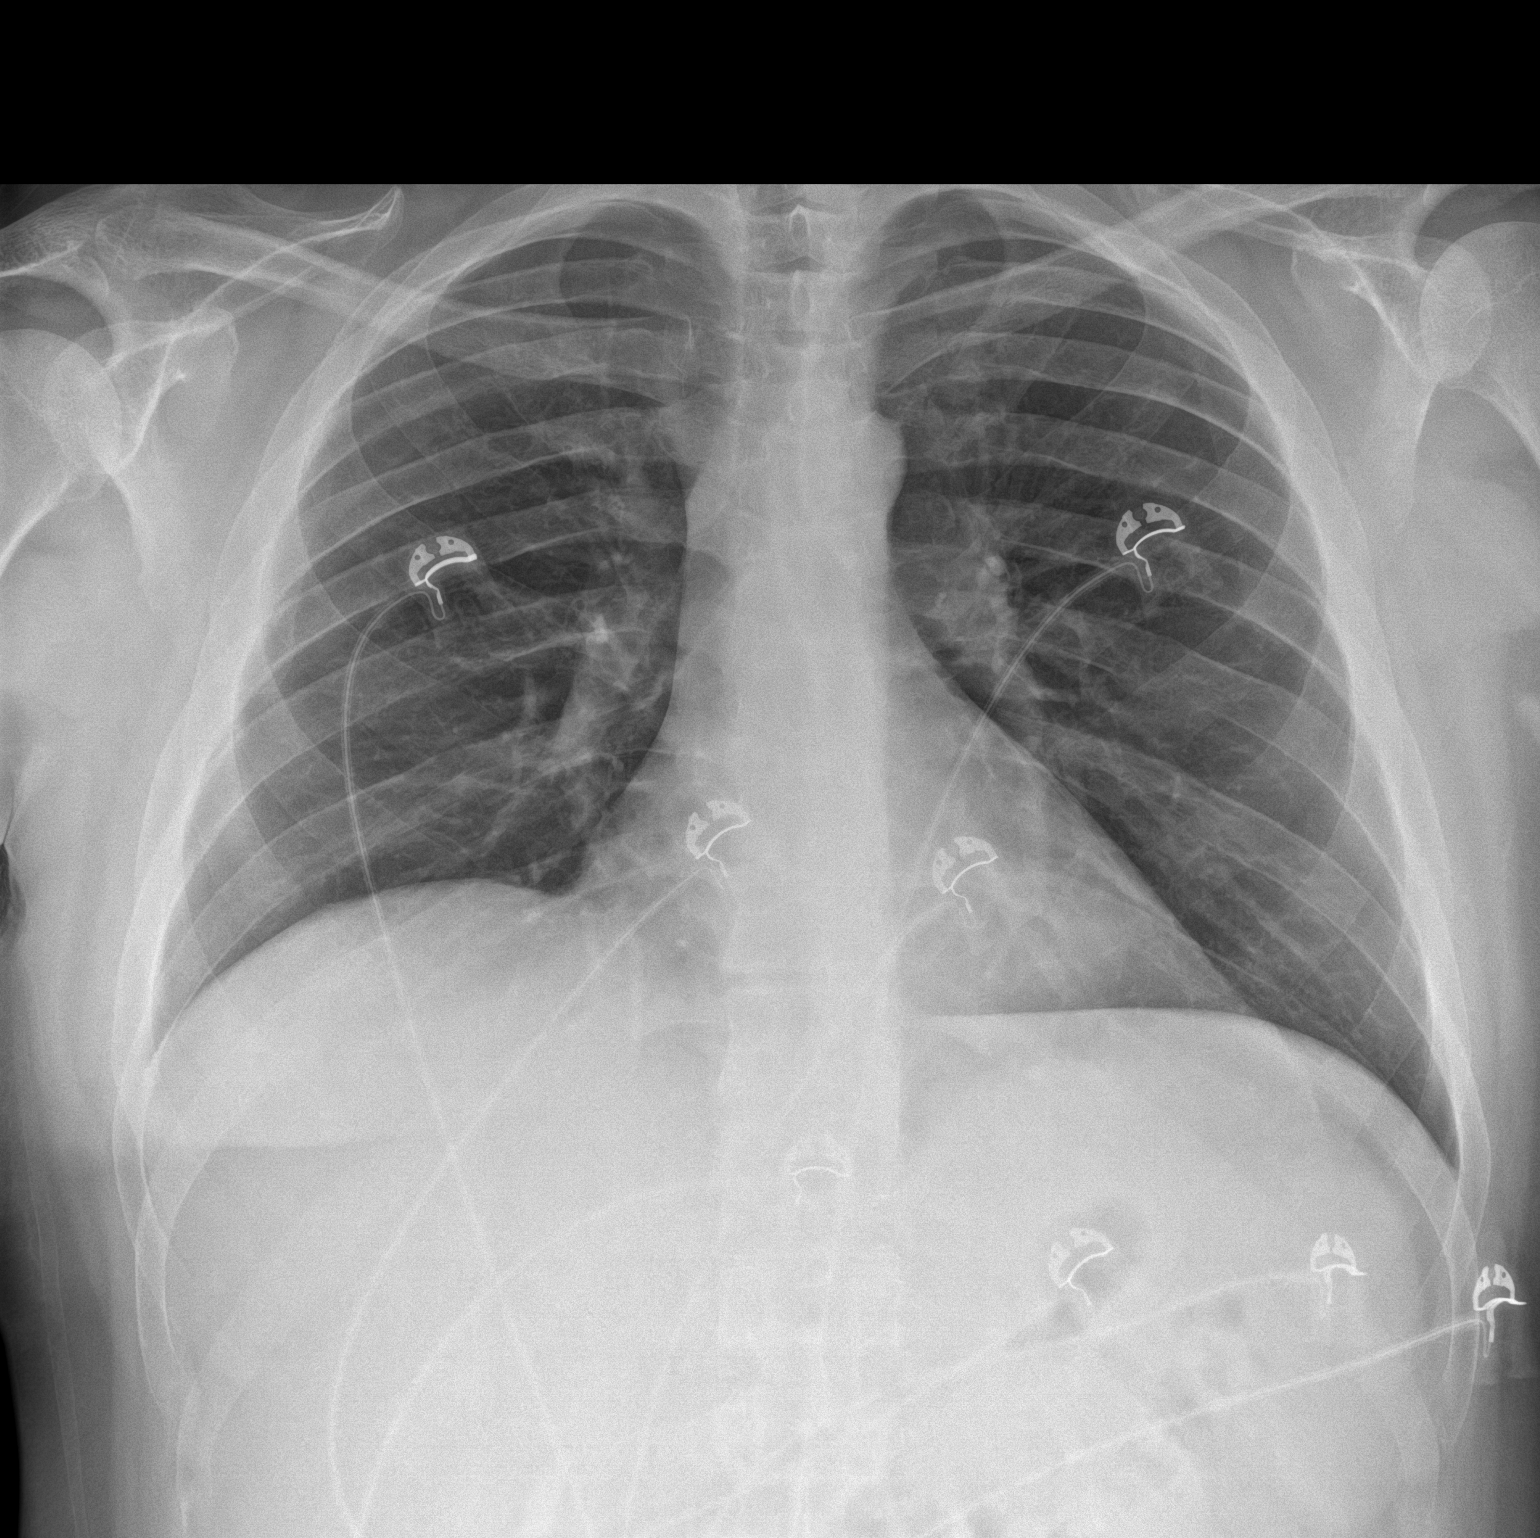

[chest lat]
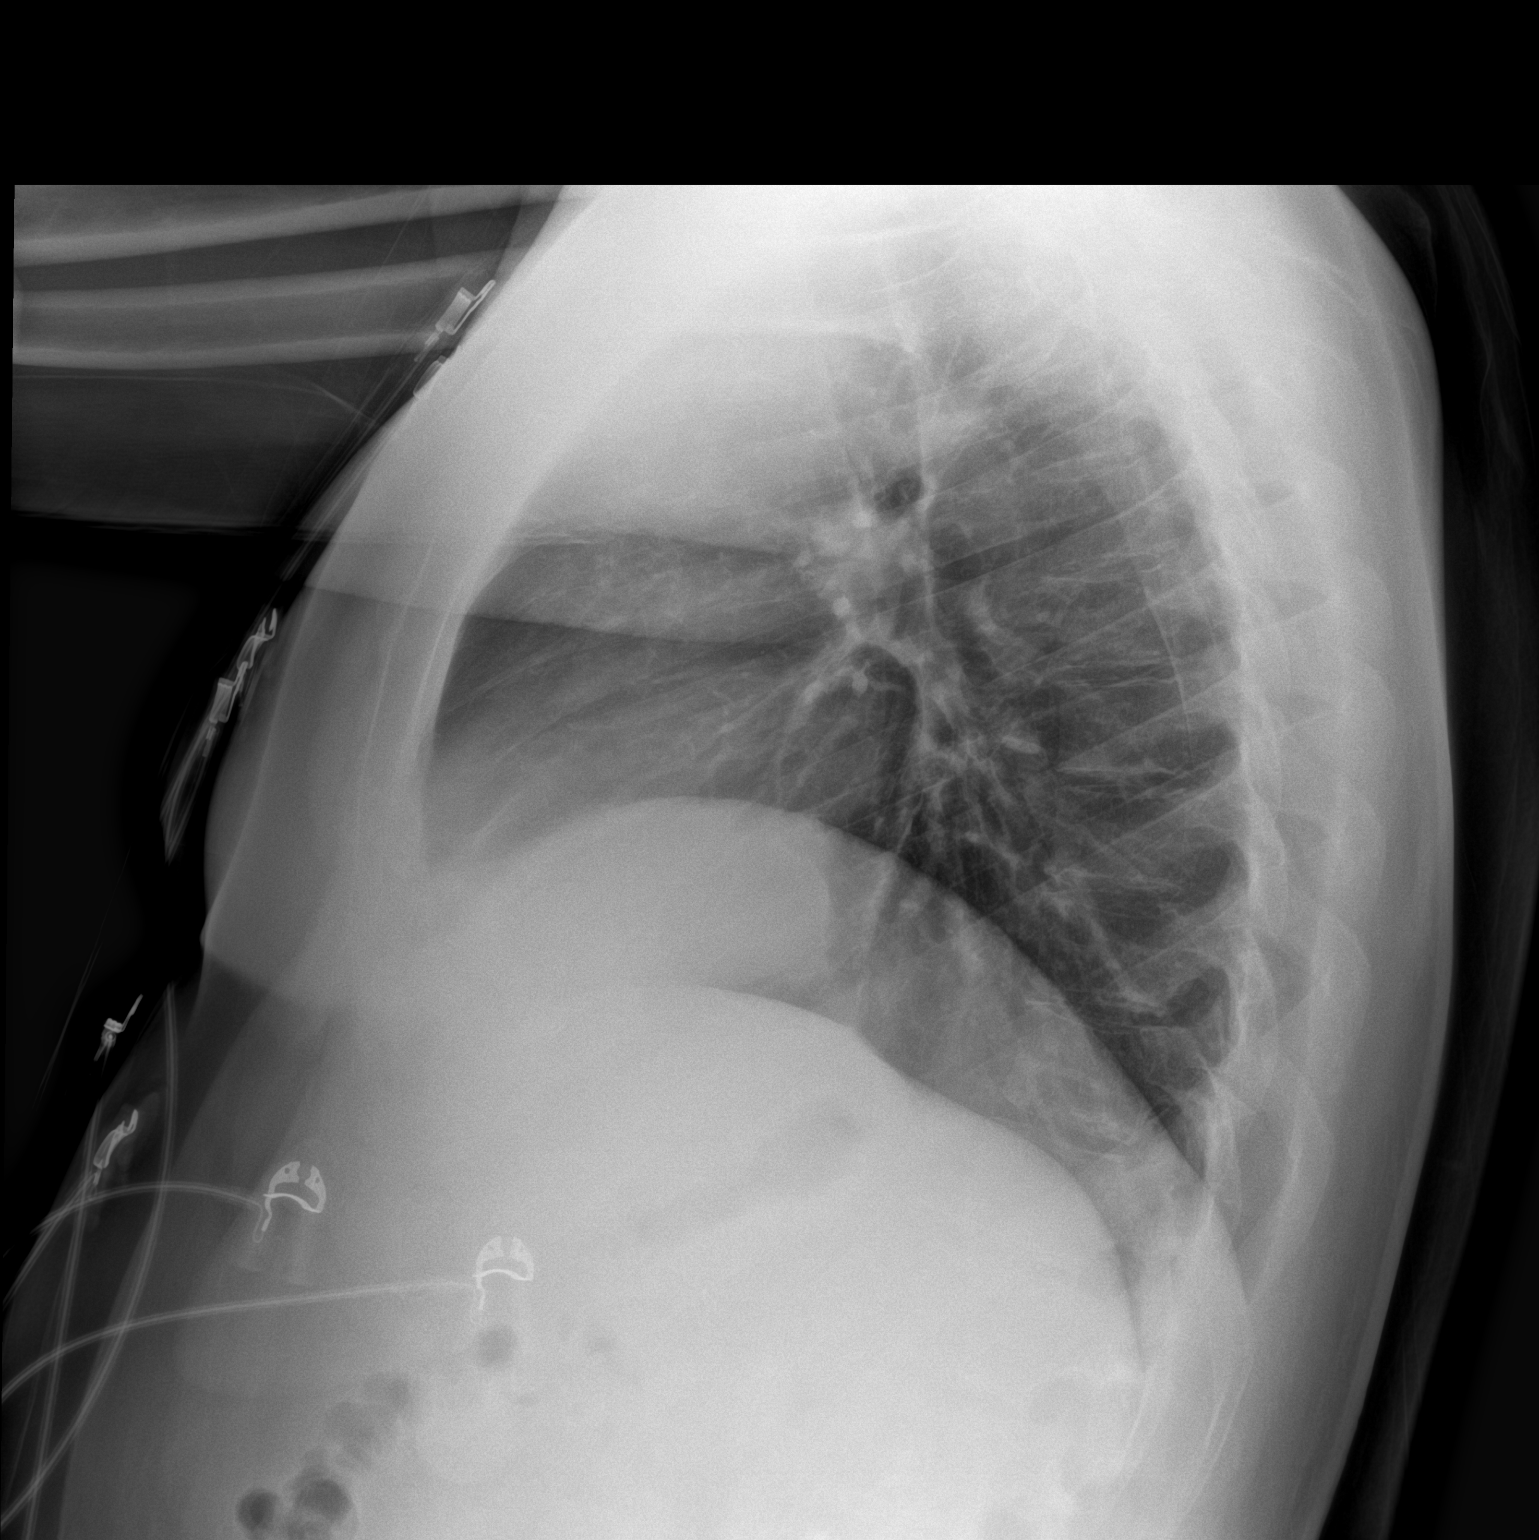

[2 of 2 positions shown; findings below may reference images not displayed]

FINDINGS: The heart size and mediastinal contours are within normal limits.
Both lungs are clear. The visualized skeletal structures are
unremarkable.
IMPRESSION: No active cardiopulmonary disease.

## 2023-03-20 IMAGING — MR MR HEAD WO/W CM
15 series · 48 of 48 positions shown · IV contrast (gadavist)
Comparison: Prior head CT from earlier the same day.

CLINICAL DATA: Initial evaluation for neuro deficit, stroke
suspected.

EXAM:
MRI HEAD WITHOUT AND WITH CONTRAST
TECHNIQUE: Multiplanar, multiecho pulse sequences of the brain and surrounding
structures were obtained without and with intravenous contrast.
CONTRAST:  10mL GADAVIST GADOBUTROL 1 MMOL/ML IV SOLN

[Series 5: DWI · axial · 3.0mm · 1.36mm/px · z∈[+4,+138]mm · 6 of 96 slices shown (1 of 2)]
[im 1/96]
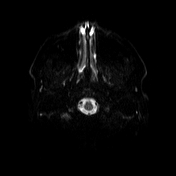
[im 20/96]
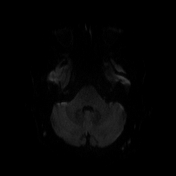
[im 39/96]
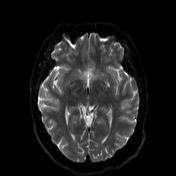
[im 58/96]
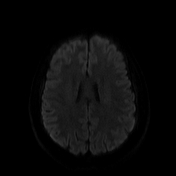
[im 77/96]
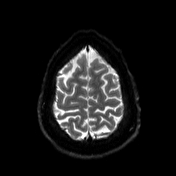
[im 96/96]
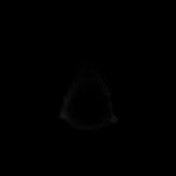

[Series 6: DWI · axial · 3.0mm · 1.36mm/px · z∈[+4,+138]mm · 3 of 48 slices shown (2 of 2)]
[im 1/48]
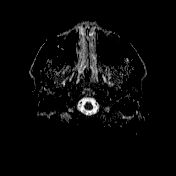
[im 24/48]
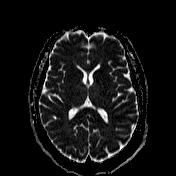
[im 48/48]
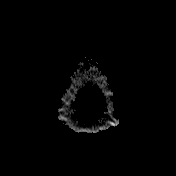

[Series 7: T1 · sagittal · 5.0mm · 0.75mm/px · 1 of 24 slices shown (1 of 4)]
[im 1/24]
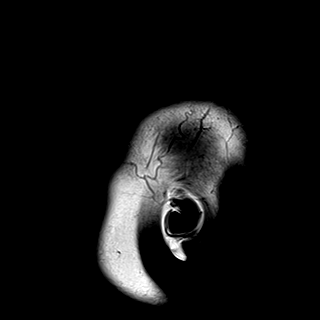

[Series 8: T2 · axial · 5.0mm · 0.62mm/px · 1 of 26 slices shown (1 of 2)]
[im 1/26]
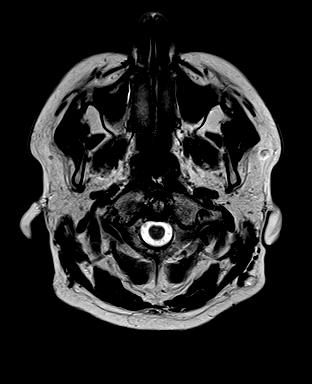

[Series 9: swi_images · axial · 3.0mm · 0.75mm/px · z∈[-8,+149]mm · 3 of 56 slices shown]
[im 1/56]
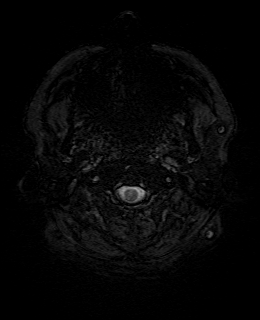
[im 28/56]
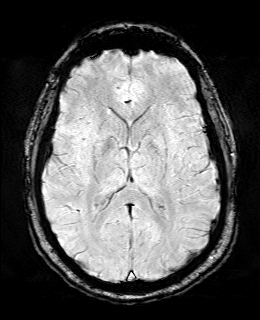
[im 56/56]
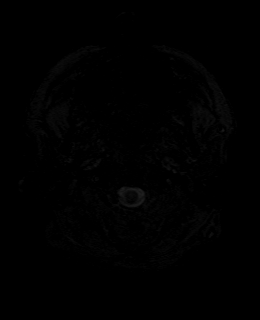

[Series 11: FLAIR · axial · 3.0mm · 0.75mm/px · z∈[-2,+144]mm · 3 of 52 slices shown]
[im 1/52]
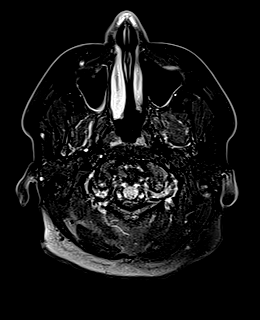
[im 26/52]
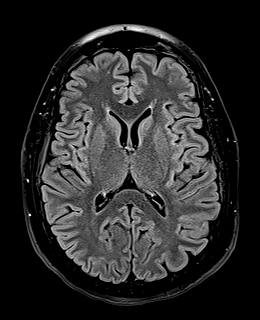
[im 52/52]
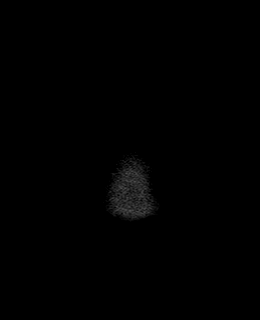

[Series 12: T1 · axial · 1.0mm · 0.94mm/px · z∈[+3,+139]mm · 8 of 144 slices shown (2 of 4)]
[im 1/144]
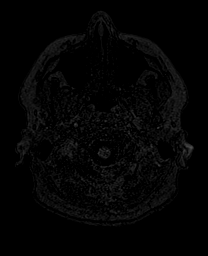
[im 21/144]
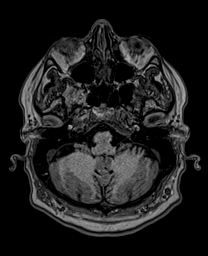
[im 41/144]
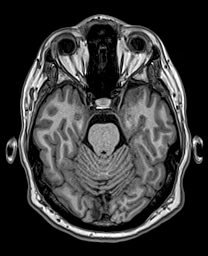
[im 62/144]
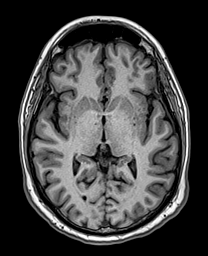
[im 82/144]
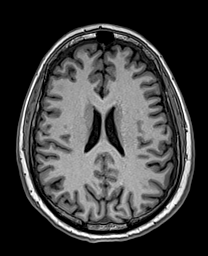
[im 103/144]
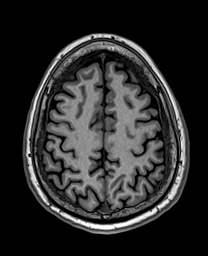
[im 123/144]
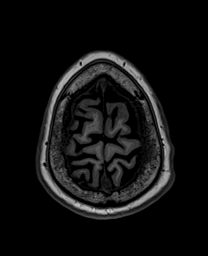
[im 144/144]
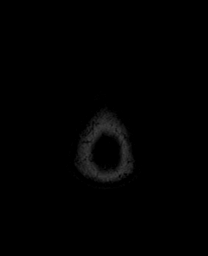

[Series 13: cor dwi_tracew · coronal · 5.0mm · 1.53mm/px · 3 of 56 slices shown]
[im 1/56]
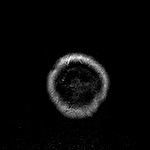
[im 28/56]
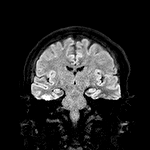
[im 56/56]
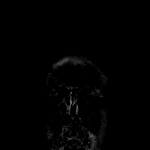

[Series 14: cor dwi_adc · coronal · 5.0mm · 1.53mm/px · 2 of 28 slices shown]
[im 1/28]
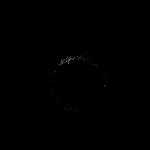
[im 28/28]
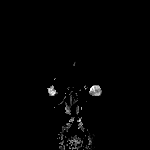

[Series 15: T2 · coronal · 5.0mm · 0.57mm/px · 2 of 28 slices shown (2 of 2)]
[im 1/28]
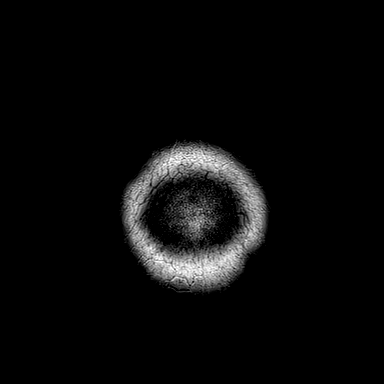
[im 28/28]
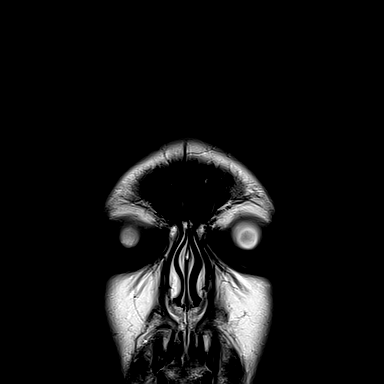

[Series 16: T1 post-contrast · axial · 1.0mm · 0.94mm/px · z∈[+3,+139]mm · 8 of 144 slices shown (1 of 3)]
[im 1/144]
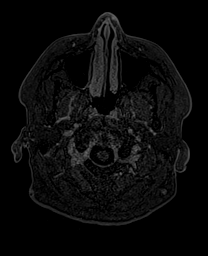
[im 21/144]
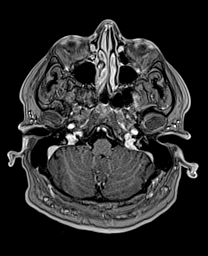
[im 41/144]
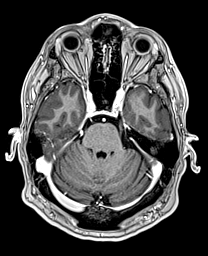
[im 62/144]
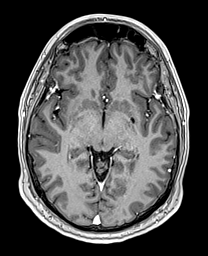
[im 82/144]
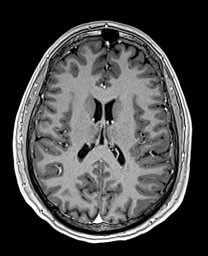
[im 103/144]
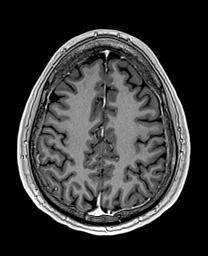
[im 123/144]
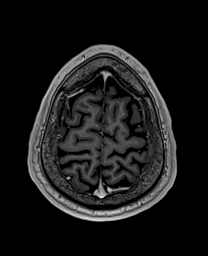
[im 144/144]
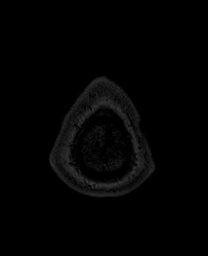

[Series 17: T1 · sagittal · 4.0mm · 0.94mm/px · 2 of 37 slices shown (3 of 4)]
[im 1/37]
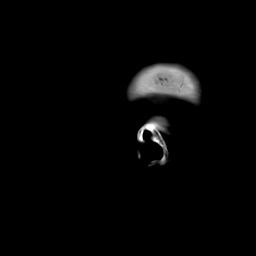
[im 37/37]
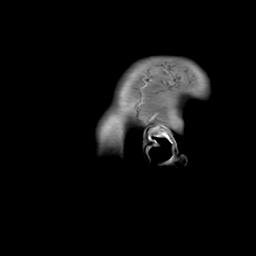

[Series 18: T1 · coronal · 4.0mm · 0.94mm/px · 3 of 46 slices shown (4 of 4)]
[im 1/46]
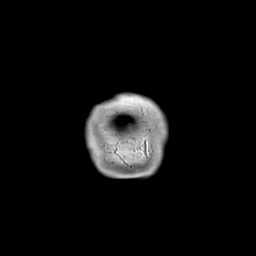
[im 23/46]
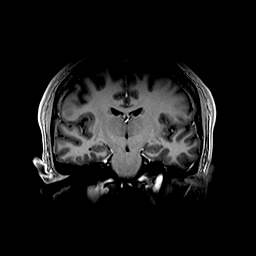
[im 46/46]
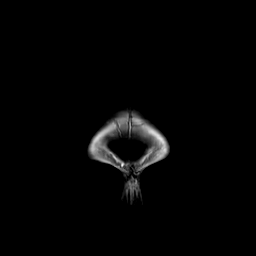

[Series 19: T1 post-contrast · coronal · 5.0mm · 0.43mm/px · 2 of 28 slices shown (2 of 3)]
[im 1/28]
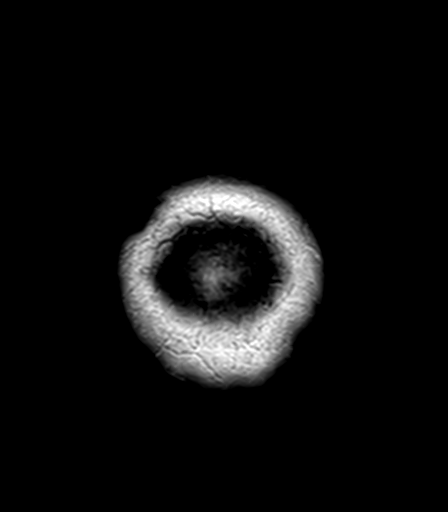
[im 28/28]
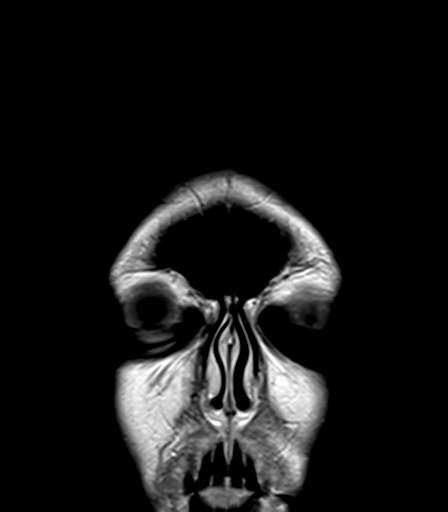

[Series 20: T1 post-contrast · sagittal · 5.0mm · 0.75mm/px · 1 of 24 slices shown (3 of 3)]
[im 1/24]
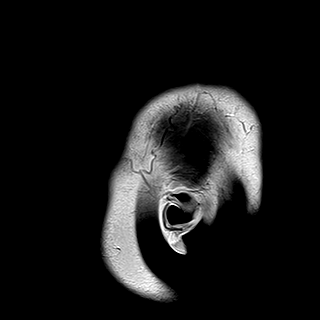

[48 of 48 positions shown; findings below may reference images not displayed]

FINDINGS: Brain: Cerebral volume within normal limits. No focal parenchymal
signal abnormality or significant cerebral white matter disease. No
abnormal foci of restricted diffusion to suggest acute or subacute
ischemia. Gray-white matter differentiation maintained. No
encephalomalacia to suggest chronic cortical infarction. No acute or
chronic intracranial blood products.

No mass lesion, midline shift or mass effect no hydrocephalus or
extra-axial fluid collection. Pituitary gland suprasellar region
normal. Midline structures intact and normally formed.

No abnormal enhancement.

Vascular: Major intracranial vascular flow voids are maintained.

Skull and upper cervical spine: Minimal cerebellar tonsillar ectopia
of without frank Chiari malformation noted. Craniocervical junction
otherwise unremarkable. Bone marrow signal intensity normal. No
scalp soft tissue abnormality.

Sinuses/Orbits: Globes orbital soft tissues within normal limits.
Paranasal sinuses are largely clear. No significant mastoid
effusion. Inner ear structures grossly normal.

Other: None.
IMPRESSION: Normal brain MRI.  No acute intracranial abnormality.
